# Patient Record
Sex: Male | Born: 1985 | Race: Black or African American | Hispanic: No | Marital: Married | Smoking: Never smoker
Health system: Southern US, Community
[De-identification: ages and names within clinical notes are randomized; demographics above are authoritative.]

## PROBLEM LIST (undated history)

## (undated) DIAGNOSIS — R079 Chest pain, unspecified: Secondary | ICD-10-CM

## (undated) DIAGNOSIS — F329 Major depressive disorder, single episode, unspecified: Secondary | ICD-10-CM

## (undated) DIAGNOSIS — I10 Essential (primary) hypertension: Secondary | ICD-10-CM

## (undated) DIAGNOSIS — F32A Depression, unspecified: Secondary | ICD-10-CM

---
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
## 2016-07-29 HISTORY — PX: CARDIAC SURGERY: SHX584

## 2016-09-09 ENCOUNTER — Emergency Department (HOSPITAL_COMMUNITY)
Admission: EM | Admit: 2016-09-09 | Discharge: 2016-09-09 | Disposition: A | Discharge status: Home / Self Care | Payer: Medicaid Other | Attending: Emergency Medicine | Admitting: Emergency Medicine

## 2016-09-09 ENCOUNTER — Emergency Department (HOSPITAL_COMMUNITY): Payer: Medicaid Other

## 2016-09-09 ENCOUNTER — Encounter (HOSPITAL_COMMUNITY): Payer: Self-pay | Admitting: Emergency Medicine

## 2016-09-09 DIAGNOSIS — R791 Abnormal coagulation profile: Secondary | ICD-10-CM | POA: Insufficient documentation

## 2016-09-09 DIAGNOSIS — R05 Cough: Secondary | ICD-10-CM | POA: Diagnosis present

## 2016-09-09 DIAGNOSIS — J189 Pneumonia, unspecified organism: Secondary | ICD-10-CM

## 2016-09-09 DIAGNOSIS — I10 Essential (primary) hypertension: Secondary | ICD-10-CM | POA: Insufficient documentation

## 2016-09-09 HISTORY — DX: Essential (primary) hypertension: I10

## 2016-09-09 LAB — PROTIME-INR
INR: 1.71
PROTHROMBIN TIME: 20.2 s — AB (ref 11.4–15.2)

## 2016-09-09 LAB — CBC
HCT: 34.5 % — ABNORMAL LOW (ref 39.0–52.0)
HEMOGLOBIN: 11.5 g/dL — AB (ref 13.0–17.0)
MCH: 29.8 pg (ref 26.0–34.0)
MCHC: 33.3 g/dL (ref 30.0–36.0)
MCV: 89.4 fL (ref 78.0–100.0)
PLATELETS: 302 10*3/uL (ref 150–400)
RBC: 3.86 MIL/uL — ABNORMAL LOW (ref 4.22–5.81)
RDW: 13.5 % (ref 11.5–15.5)
WBC: 9.1 10*3/uL (ref 4.0–10.5)

## 2016-09-09 LAB — BASIC METABOLIC PANEL
ANION GAP: 7 (ref 5–15)
BUN: 12 mg/dL (ref 6–20)
CALCIUM: 9.5 mg/dL (ref 8.9–10.3)
CO2: 28 mmol/L (ref 22–32)
Chloride: 101 mmol/L (ref 101–111)
Creatinine, Ser: 0.85 mg/dL (ref 0.61–1.24)
GLUCOSE: 106 mg/dL — AB (ref 65–99)
Potassium: 4 mmol/L (ref 3.5–5.1)
Sodium: 136 mmol/L (ref 135–145)

## 2016-09-09 MED ORDER — AMOXICILLIN-POT CLAVULANATE 875-125 MG PO TABS: 1.0000 | ORAL_TABLET | Freq: Two times a day (BID) | ORAL | 0 refills | Status: DC

## 2016-09-09 NOTE — ED Provider Notes (Signed)
[facility] DEPT Provider Note   CSN: [id number] Arrival date & time: 09/09/16  4098     History   Chief Complaint Chief Complaint  Patient presents with  . Fever  . Chest Pain    HPI Robert Mcmahon is a 31 y.o. male.  31 year old male with history of mitral valve replacement 6 weeks ago presents with cough and congestion 2 days. Also notes nasal congestion as well as bilateral ear pain. No vomiting or diarrhea. Some subjective myalgias. Denies any headache or photophobia. No pedal edema. Symptoms are persistent and nothing makes them better worse. No treatment use prior to arrival.      Past Medical History:  Diagnosis Date  . Hypertension     There are no active problems to display for this patient.   Past Surgical History:  Procedure Laterality Date  . CARDIAC SURGERY         Home Medications    Prior to Admission medications   Not on File    Family History No family history on file.  Social History Social History  Substance Use Topics  . Smoking status: Never Smoker  . Smokeless tobacco: Never Used  . Alcohol use No     Allergies   Patient has no known allergies.   Review of Systems Review of Systems  All other systems reviewed and are negative.    Physical Exam Updated Vital Signs BP 130/86 (BP Location: Left Arm)   Pulse 80   Temp 97.8 F (36.6 C) (Oral)   Resp 16   Ht  (1.702 m)   SpO2 99%   Physical Exam  Constitutional: He is oriented to person, place, and time. He appears well-developed and well-nourished.  Non-toxic appearance. No distress.  HENT:  Head: Normocephalic and atraumatic.  Eyes: Conjunctivae, EOM and lids are normal. Pupils are equal, round, and reactive to light.  Neck: Normal range of motion. Neck supple. No tracheal deviation present. No thyroid mass present.  Cardiovascular: Normal rate, regular rhythm and normal heart sounds.  Exam reveals no gallop.   No murmur heard. Pulmonary/Chest: Effort  normal and breath sounds normal. No stridor. No respiratory distress. He has no decreased breath sounds. He has no wheezes. He has no rhonchi. He has no rales.  Abdominal: Soft. Normal appearance and bowel sounds are normal. He exhibits no distension. There is no tenderness. There is no rebound and no CVA tenderness.  Musculoskeletal: Normal range of motion. He exhibits no edema or tenderness.  Neurological: He is alert and oriented to person, place, and time. He has normal strength. No cranial nerve deficit or sensory deficit. GCS eye subscore is 4. GCS verbal subscore is 5. GCS motor subscore is 6.  Skin: Skin is warm and dry. No abrasion and no rash noted.  Psychiatric: He has a normal mood and affect. His speech is normal and behavior is normal.  Nursing note and vitals reviewed.    ED Treatments / Results  Labs (all labs ordered are listed, but only abnormal results are displayed) Labs Reviewed  BASIC METABOLIC PANEL  CBC  PROTIME-INR  I-STAT TROPOININ, ED    EKG  EKG Interpretation None       Radiology Dg Chest 2 View  Result Date: 09/09/2016 CLINICAL DATA:  Status post open heart surgery 6 weeks ago. Onset of malaise and fever and new left-sided chest discomfort 4 days ago. EXAM: CHEST  2 VIEW COMPARISON:  None. FINDINGS: The lungs are well-expanded. There is patchy increased density  in the right perihilar region. Minimal similar changes are noted on the left. The heart is normal in size. The pulmonary vascularity is not engorged. There is no pleural effusion. The sternal wires appear intact. There are prosthetic cardiac valve rings in the mitral and tricuspid positions. The retrosternal soft tissues appear normal. The bony thorax exhibits no acute abnormality. IMPRESSION: Patchy increased perihilar density greatest on the right but to a lesser extent on the left may reflect atelectasis or early pneumonia. No overt CHF. Electronically Signed   By: David  [place] M.D.   On:  09/09/2016 08:03    Procedures Procedures (including critical care time)  Medications Ordered in ED Medications - No data to display   Initial Impression / Assessment and Plan / ED Course  I have reviewed the triage vital signs and the nursing notes.  Pertinent labs & imaging results that were available during my care of the patient were reviewed by me and considered in my medical decision making (see chart for details).     Patient with evidence of pneumonia on chest x-ray. Will place on Zithromax and return precautions given  Final Clinical Impressions(s) / ED Diagnoses   Final diagnoses:  None    New Prescriptions New Prescriptions   No medications on file     Lorre Nick, MD 09/09/16 [phone]

## 2016-09-09 NOTE — ED Notes (Signed)
Patient transported to X-ray 

## 2016-09-09 NOTE — ED Triage Notes (Addendum)
patient reports he had open heart surgery 6 weeks ago in [place] and over the past 4 days has been running fevers (but hasnt checked his temperature), not feeling well and was told that if he doesn't feel well after surgery to go to hospital.  Patient also c/o chest pain in left side.

## 2016-09-18 ENCOUNTER — Emergency Department (HOSPITAL_COMMUNITY): Payer: Medicaid Other

## 2016-09-18 ENCOUNTER — Inpatient Hospital Stay (HOSPITAL_COMMUNITY)
Admission: EM | Admit: 2016-09-18 | Discharge: 2016-09-22 | DRG: 287 | Disposition: A | Discharge status: Home / Self Care | Payer: Medicaid Other | Attending: Internal Medicine | Admitting: Internal Medicine

## 2016-09-18 ENCOUNTER — Encounter (HOSPITAL_COMMUNITY): Payer: Self-pay | Admitting: Emergency Medicine

## 2016-09-18 DIAGNOSIS — R079 Chest pain, unspecified: Secondary | ICD-10-CM | POA: Diagnosis present

## 2016-09-18 DIAGNOSIS — R0789 Other chest pain: Secondary | ICD-10-CM | POA: Diagnosis present

## 2016-09-18 DIAGNOSIS — I10 Essential (primary) hypertension: Secondary | ICD-10-CM | POA: Diagnosis present

## 2016-09-18 DIAGNOSIS — Z9889 Other specified postprocedural states: Secondary | ICD-10-CM | POA: Diagnosis not present

## 2016-09-18 DIAGNOSIS — R748 Abnormal levels of other serum enzymes: Secondary | ICD-10-CM | POA: Diagnosis not present

## 2016-09-18 DIAGNOSIS — I272 Pulmonary hypertension, unspecified: Secondary | ICD-10-CM | POA: Diagnosis present

## 2016-09-18 DIAGNOSIS — Z952 Presence of prosthetic heart valve: Secondary | ICD-10-CM | POA: Diagnosis not present

## 2016-09-18 DIAGNOSIS — R7989 Other specified abnormal findings of blood chemistry: Secondary | ICD-10-CM

## 2016-09-18 DIAGNOSIS — R778 Other specified abnormalities of plasma proteins: Secondary | ICD-10-CM | POA: Diagnosis present

## 2016-09-18 DIAGNOSIS — Z79899 Other long term (current) drug therapy: Secondary | ICD-10-CM

## 2016-09-18 DIAGNOSIS — Z7901 Long term (current) use of anticoagulants: Secondary | ICD-10-CM

## 2016-09-18 DIAGNOSIS — Z7982 Long term (current) use of aspirin: Secondary | ICD-10-CM

## 2016-09-18 DIAGNOSIS — R0600 Dyspnea, unspecified: Secondary | ICD-10-CM

## 2016-09-18 DIAGNOSIS — R791 Abnormal coagulation profile: Secondary | ICD-10-CM | POA: Diagnosis present

## 2016-09-18 DIAGNOSIS — R0602 Shortness of breath: Secondary | ICD-10-CM | POA: Diagnosis not present

## 2016-09-18 DIAGNOSIS — R072 Precordial pain: Secondary | ICD-10-CM | POA: Diagnosis not present

## 2016-09-18 HISTORY — DX: Chest pain, unspecified: R07.9

## 2016-09-18 LAB — BASIC METABOLIC PANEL
Anion gap: 7 (ref 5–15)
BUN: 15 mg/dL (ref 6–20)
CHLORIDE: 102 mmol/L (ref 101–111)
CO2: 29 mmol/L (ref 22–32)
CREATININE: 0.92 mg/dL (ref 0.61–1.24)
Calcium: 9.9 mg/dL (ref 8.9–10.3)
GFR calc non Af Amer: >60 mL/min (ref >60)
GLUCOSE: 114 mg/dL — AB (ref 65–99)
Potassium: 3.7 mmol/L (ref 3.5–5.1)
Sodium: 138 mmol/L (ref 135–145)

## 2016-09-18 LAB — SEDIMENTATION RATE: SED RATE: 11 mm/h (ref 0–16)

## 2016-09-18 LAB — I-STAT TROPONIN, ED
Troponin i, poc: 0.44 ng/mL (ref 0.00–0.08)
Troponin i, poc: 0.91 ng/mL (ref 0.00–0.08)

## 2016-09-18 LAB — CBC
HCT: 38.4 % — ABNORMAL LOW (ref 39.0–52.0)
Hemoglobin: 12.7 g/dL — ABNORMAL LOW (ref 13.0–17.0)
MCH: 28.9 pg (ref 26.0–34.0)
MCHC: 33.1 g/dL (ref 30.0–36.0)
MCV: 87.3 fL (ref 78.0–100.0)
PLATELETS: 414 10*3/uL — AB (ref 150–400)
RBC: 4.4 MIL/uL (ref 4.22–5.81)
RDW: 12.8 % (ref 11.5–15.5)
WBC: 9.2 10*3/uL (ref 4.0–10.5)

## 2016-09-18 LAB — TROPONIN I: TROPONIN I: 1.09 ng/mL — AB (ref <0.03)

## 2016-09-18 LAB — PROTIME-INR
INR: 1.5
Prothrombin Time: 18.2 seconds — ABNORMAL HIGH (ref 11.4–15.2)

## 2016-09-18 MED ORDER — WARFARIN SODIUM 5 MG PO TABS
5.0000 mg | ORAL_TABLET | Freq: Once | ORAL | Status: AC
  Administered 2016-09-18: 5 mg via ORAL
  Filled 2016-09-18: qty 1

## 2016-09-18 MED ORDER — SODIUM CHLORIDE 0.9 % IV SOLN
INTRAVENOUS | Status: DC
  Administered 2016-09-18: 16:00:00 via INTRAVENOUS

## 2016-09-18 MED ORDER — FENTANYL CITRATE (PF) 100 MCG/2ML IJ SOLN
50.0000 ug | Freq: Once | INTRAMUSCULAR | Status: AC
  Administered 2016-09-18: 50 ug via INTRAVENOUS
  Filled 2016-09-18: qty 2

## 2016-09-18 MED ORDER — FENTANYL CITRATE (PF) 100 MCG/2ML IJ SOLN
100.0000 ug | Freq: Once | INTRAMUSCULAR | Status: AC
  Administered 2016-09-18: 100 ug via INTRAVENOUS
  Filled 2016-09-18: qty 2

## 2016-09-18 MED ORDER — ASPIRIN 81 MG PO CHEW
324.0000 mg | CHEWABLE_TABLET | Freq: Once | ORAL | Status: AC
  Administered 2016-09-18: 324 mg via ORAL
  Filled 2016-09-18: qty 4

## 2016-09-18 MED ORDER — WARFARIN - PHARMACIST DOSING INPATIENT: Freq: Every day | Status: DC

## 2016-09-18 MED ORDER — ONDANSETRON HCL 4 MG/2ML IJ SOLN
4.0000 mg | Freq: Once | INTRAMUSCULAR | Status: AC
  Administered 2016-09-18: 4 mg via INTRAVENOUS
  Filled 2016-09-18: qty 2

## 2016-09-18 NOTE — ED Provider Notes (Signed)
[facility] Provider Note   CSN: [id number] Arrival date & time: 09/18/16  1407     History   Chief Complaint Chief Complaint  Patient presents with  . Chest Pain  . Fatigue    HPI Robert Mcmahon is a 31 y.o. male.  He presents for evaluation of chest discomfort, bilateral anterior which he noticed this morning.  The pain is constant and at the time seen, 8 over 10.  There is no associated shortness of breath, fever, weakness or dizziness.  He has had intermittent cough for 2 days.  He also had some cough 10 days ago when he was seen and evaluated in the emergency department started on Zithromax for possible pneumonia.  He does not currently have a physician here in [place].  6 weeks ago he had replacement of native diseased mitral valve, congenital, that was "leaking".  He states he is compliant with his medications.  He is currently here in [place], visiting family members.  He denies fever, neck pain, back pain, or headache.  There are no other known modifying factors.  HPI  Past Medical History:  Diagnosis Date  . Hypertension     Patient Active Problem List   Diagnosis Date Noted  . Chest pain 09/18/2016    Past Surgical History:  Procedure Laterality Date  . CARDIAC SURGERY         Home Medications    Prior to Admission medications   Medication Sig Start Date End Date Taking? Authorizing Provider  aspirin EC 81 MG tablet Take 81 mg by mouth daily.   Yes [provider]  docusate sodium (COLACE) 100 MG capsule Take 100 mg by mouth 2 (two) times daily.   Yes [provider]  ibuprofen (ADVIL,MOTRIN) 200 MG tablet Take 600 mg by mouth every 6 (six) hours as needed for moderate pain.   Yes [provider]  ketorolac (TORADOL) 10 MG tablet Take 10 mg by mouth every 6 (six) hours as needed for severe pain.   Yes [provider]  warfarin (COUMADIN) 3 MG tablet Take 3 mg by mouth daily. Take 4.5 mg by mouth daily. Pt takes  3mg  by mouth every morning and 1.5 mg by mouth every evening.   Yes [provider]  amoxicillin-clavulanate (AUGMENTIN) 875-125 MG tablet Take 1 tablet by mouth every 12 (twelve) hours. Patient not taking: Reported on 09/18/2016 09/09/16   Lorre Nick, MD    Family History No family history on file.  Social History Social History  Substance Use Topics  . Smoking status: Never Smoker  . Smokeless tobacco: Never Used  . Alcohol use No     Allergies   Patient has no known allergies.   Review of Systems Review of Systems  All other systems reviewed and are negative.    Physical Exam Updated Vital Signs BP 130/78 (BP Location: Right Arm)   Pulse 70   Temp 98.4 F (36.9 C) (Oral)   Resp 18   Ht 5\' 7"  (1.702 m)   Wt 132 lb (59.9 kg)   SpO2 100%   BMI 20.67 kg/m   Physical Exam  Constitutional: He is oriented to person, place, and time. He appears well-developed and well-nourished.  HENT:  Head: Normocephalic and atraumatic.  Right Ear: External ear normal.  Left Ear: External ear normal.  Eyes: Conjunctivae and EOM are normal. Pupils are equal, round, and reactive to light.  Neck: Normal range of motion and phonation normal. Neck supple.  Cardiovascular: Normal  rate and regular rhythm.  Exam reveals no gallop and no friction rub.   Murmur (3 or 6 anterior chest, systolic) heard. Pulmonary/Chest: Effort normal and breath sounds normal. He exhibits no bony tenderness.  Abdominal: Soft. There is no tenderness.  Musculoskeletal: Normal range of motion.  Neurological: He is alert and oriented to person, place, and time. No cranial nerve deficit or sensory deficit. He exhibits normal muscle tone. Coordination normal.  Skin: Skin is warm, dry and intact.  Psychiatric: He has a normal mood and affect. His behavior is normal. Judgment and thought content normal.  Nursing note and vitals reviewed.    ED Treatments / Results  Labs (all labs ordered are listed,  but only abnormal results are displayed) Labs Reviewed  BASIC METABOLIC PANEL - Abnormal; Notable for the following:       Result Value   Glucose, Bld 114 (*)    All other components within normal limits  CBC - Abnormal; Notable for the following:    Hemoglobin 12.7 (*)    HCT 38.4 (*)    Platelets 414 (*)    All other components within normal limits  PROTIME-INR - Abnormal; Notable for the following:    Prothrombin Time 18.2 (*)    All other components within normal limits  TROPONIN I - Abnormal; Notable for the following:    Troponin I 1.09 (*)    All other components within normal limits  I-STAT TROPOININ, ED - Abnormal; Notable for the following:    Troponin i, poc 0.44 (*)    All other components within normal limits  I-STAT TROPOININ, ED - Abnormal; Notable for the following:    Troponin i, poc 0.91 (*)    All other components within normal limits  SEDIMENTATION RATE  PROTIME-INR  C-REACTIVE PROTEIN    EKG  EKG Interpretation  Date/Time:  Wednesday Sep 18 2016 14:43:44 EDT Ventricular Rate:  95 PR Interval:    QRS Duration: 110 QT Interval:  358 QTC Calculation: 450 R Axis:   -44 Text Interpretation:  Sinus rhythm Prolonged PR interval LVH with IVCD, LAD and secondary repol abnrm since last tracing no significant change Confirmed by Mancel BaleWentz, Sueanne Maniaci [phone]([id number]) on 09/18/2016 3:34:31 PM       Radiology Dg Chest 2 View  Result Date: 09/18/2016 CLINICAL DATA:  Chest pain EXAM: CHEST  2 VIEW COMPARISON:  09/09/2016 FINDINGS: Cardiac shadow is within normal limits. Postsurgical changes are seen. No focal infiltrate or sizable effusion is seen. No bony abnormality is noted. IMPRESSION: No acute abnormality noted. Stable postoperative changes. Electronically Signed   By: Alcide CleverMark  Lukens M.D.   On: 09/18/2016 15:29    Procedures Procedures (including critical care time)  Medications Ordered in ED Medications  0.9 %  sodium chloride infusion ( Intravenous New Bag/Given 09/18/16  1610)  Warfarin - Pharmacist Dosing Inpatient (not administered)  fentaNYL (SUBLIMAZE) injection 50 mcg (50 mcg Intravenous Given 09/18/16 1610)  ondansetron (ZOFRAN) injection 4 mg (4 mg Intravenous Given 09/18/16 1610)  aspirin chewable tablet 324 mg (324 mg Oral Given 09/18/16 1609)  warfarin (COUMADIN) tablet 5 mg (5 mg Oral Given 09/18/16 1927)  fentaNYL (SUBLIMAZE) injection 100 mcg (100 mcg Intravenous Given 09/18/16 2116)     Initial Impression / Assessment and Plan / ED Course  I have reviewed the triage vital signs and the nursing notes.  Pertinent labs & imaging results that were available during my care of the patient were reviewed by me and considered in my medical decision  making (see chart for details).  Clinical Course as of Sep 18 2212  Wed Sep 18, 2016  1617 Slight elevation, no comparisons available Troponin i, poc: (!!) 0.44 [EW]  1617 Subtherapeutic Prothrombin Time: (!) 18.2 [EW]  1618 Slightly low Hemoglobin: (!) 12.7 [EW]  1619 Patient is nontoxic, with minimally elevated troponin level and nonischemic EKG.  Doubt ACS, or unstable cardiac process.  We will attempt to obtain old records from his facility in [place] where he had surgery 6 weeks ago.  We will continue to reassess and anticipate getting a delta troponin, at 6:15 PM.  [EW]  1951 Rising delta troponin Troponin i, poc: (!!) 0.91 [EW]  1956 At this time the patient states his pain improved after the fentanyl, but is still present at 6/10.  No further complaints.  Still awaiting records transferred from [place].  Will contact cardiology to discuss case and get guidance on next step.  [EW]  2007 Discussed with cardiology, Dr. Mayford Knife, who recommends checking the lab troponin, and try to assess if patient has had a cardiac catheterization, when his records get sent.  Will recontact cardiology after the troponin results.  [EW]  2117 His chest pain is returning, and he would like some more medicine now.  He states the  pain is not pleuritic.  It continues to be bilateral and anterior.  [EW]    Clinical Course User Index [EW] Mancel Bale, MD    Medications  0.9 %  sodium chloride infusion ( Intravenous New Bag/Given 09/18/16 1610)  Warfarin - Pharmacist Dosing Inpatient (not administered)  fentaNYL (SUBLIMAZE) injection 50 mcg (50 mcg Intravenous Given 09/18/16 1610)  ondansetron (ZOFRAN) injection 4 mg (4 mg Intravenous Given 09/18/16 1610)  aspirin chewable tablet 324 mg (324 mg Oral Given 09/18/16 1609)  warfarin (COUMADIN) tablet 5 mg (5 mg Oral Given 09/18/16 1927)  fentaNYL (SUBLIMAZE) injection 100 mcg (100 mcg Intravenous Given 09/18/16 2116)    Patient Vitals for the past 24 hrs:  BP Temp Temp src Pulse Resp SpO2 Height Weight  09/18/16 1919 130/78 98.4 F (36.9 C) Oral 70 18 100 % - -  09/18/16 1430 - - - - - - 5\' 7"  (1.702 m) 132 lb (59.9 kg)  09/18/16 1429 130/76 98.1 F (36.7 C) Oral (!) 101 - 100 % - -   22: 05-discussed with cardiology, fellow on-call, who accepts patient in transfer to admit at [facility].  10:14 PM Reevaluation with update and discussion. After initial assessment and treatment, an updated evaluation reveals he is fairly comfortable but continues to be in pain.  Findings discussed with the patient.  He agrees to transfer, for further evaluation and treatment. Gracelin Weisberg L    Final Clinical Impressions(s) / ED Diagnoses   Final diagnoses:  Chest pain, unspecified type   Nonspecific chest pain with elevated troponin, 6 weeks post replacement mitral valve, with mechanical device.  INR subtherapeutic, treated with oral warfarin.  Troponin climbing during ED evaluation.  Clinically patient with chest wall discomfort, and no associated shortness of breath.  Old records obtained from [facility] health care system in [place], indicate that he had a normal cardiac catheterization, with the exception of pulmonary hypertension and severe mitral regurgitation.   January 2018 however there is no direct, on the coronary arteries.  He requires cardiology evaluation.    Nursing Notes Reviewed/ Care Coordinated Applicable Imaging Reviewed Interpretation of Laboratory Data incorporated into ED treatment   New Prescriptions New Prescriptions  No medications on file     Mancel Bale, MD 09/19/16 [phone]

## 2016-09-18 NOTE — ED Notes (Signed)
Date and time results received: 9:25 PM Test: Troponin Critical Value: 1.09  Name of Provider Notified: Effie ShyWentz

## 2016-09-18 NOTE — Progress Notes (Signed)
ANTICOAGULATION CONSULT NOTE - Initial Consult  Pharmacy Consult for warfarin Indication: mechanical valve  No Known Allergies  Patient Measurements: Height: 5\' 7"  (170.2 cm) Weight: 132 lb (59.9 kg) IBW/kg (Calculated) : 66.1   Vital Signs: Temp: 98.1 F (36.7 C) (05/09 1429) Temp Source: Oral (05/09 1429) BP: 130/76 (05/09 1429) Pulse Rate: 101 (05/09 1429)  Labs:  Recent Labs  09/18/16 1431 09/18/16 1512  HGB 12.7*  --   HCT 38.4*  --   PLT 414*  --   LABPROT  --  18.2*  INR  --  1.50  CREATININE 0.92  --     Estimated Creatinine Clearance: 97.7 mL/min (by C-G formula based on SCr of 0.92 mg/dL).   Medical History: Past Medical History:  Diagnosis Date  . Hypertension      Assessment: 31 yo male presents with chest discomfort.   6 weeks ago he had replacement of native diseased mitral valve, congenital, that was "leaking".  PTA warfarin of 4.5mg  daily (takes 3mg  in the am and 1.5mg  in the pm)  09/18/2016 INR 1.5 subtherapeutic Hgb 12.7 Plts 414 DI: Pt also takes daily ASA and prn ibuprofen,  azithromycin ~10 days ago for pna (this interaction would increase INR)   Goal of Therapy:  INR 2.5-3.5   Plan:  Pt took 3mg  dose this am will give an additional 5mg  tonight  Consider adding enoxaparin til INR therapeutic  Daily INR  Arley Phenixllen Shirline Kendle RPh 09/18/2016, 4:33 PM Pager [phone]

## 2016-09-18 NOTE — ED Notes (Signed)
Notified EDP,Wentz,MD., pt. I-stat troponin results 0.91 and RN,Matt made aware.

## 2016-09-18 NOTE — ED Triage Notes (Signed)
Pt from home with complaints of chest pain and fatigue. Pt states his chest pain is in the center of his chest and does not radiate. He states pain began this morning. Pt states he had open heart surgery 7 weeks ago for a leaky valve. Pt reports pain is 8/10. Pt has strong bilateral radial pulses and normal neuro exam.

## 2016-09-19 ENCOUNTER — Inpatient Hospital Stay (HOSPITAL_COMMUNITY): Payer: Medicaid Other

## 2016-09-19 ENCOUNTER — Encounter (HOSPITAL_COMMUNITY): Payer: Self-pay | Admitting: General Practice

## 2016-09-19 DIAGNOSIS — R0789 Other chest pain: Principal | ICD-10-CM

## 2016-09-19 DIAGNOSIS — Z9889 Other specified postprocedural states: Secondary | ICD-10-CM

## 2016-09-19 DIAGNOSIS — R079 Chest pain, unspecified: Secondary | ICD-10-CM

## 2016-09-19 DIAGNOSIS — R072 Precordial pain: Secondary | ICD-10-CM

## 2016-09-19 DIAGNOSIS — Z952 Presence of prosthetic heart valve: Secondary | ICD-10-CM

## 2016-09-19 DIAGNOSIS — R0600 Dyspnea, unspecified: Secondary | ICD-10-CM

## 2016-09-19 DIAGNOSIS — R7989 Other specified abnormal findings of blood chemistry: Secondary | ICD-10-CM

## 2016-09-19 DIAGNOSIS — R748 Abnormal levels of other serum enzymes: Secondary | ICD-10-CM

## 2016-09-19 DIAGNOSIS — R778 Other specified abnormalities of plasma proteins: Secondary | ICD-10-CM

## 2016-09-19 HISTORY — DX: Chest pain, unspecified: R07.9

## 2016-09-19 LAB — TROPONIN I
TROPONIN I: 0.52 ng/mL — AB (ref <0.03)
TROPONIN I: 0.52 ng/mL — AB (ref <0.03)
Troponin I: 0.68 ng/mL (ref <0.03)

## 2016-09-19 LAB — CBC
HEMATOCRIT: 34.8 % — AB (ref 39.0–52.0)
Hemoglobin: 11.5 g/dL — ABNORMAL LOW (ref 13.0–17.0)
MCH: 28.9 pg (ref 26.0–34.0)
MCHC: 33 g/dL (ref 30.0–36.0)
MCV: 87.4 fL (ref 78.0–100.0)
PLATELETS: 350 10*3/uL (ref 150–400)
RBC: 3.98 MIL/uL — AB (ref 4.22–5.81)
RDW: 13 % (ref 11.5–15.5)
WBC: 7.2 10*3/uL (ref 4.0–10.5)

## 2016-09-19 LAB — PROTIME-INR
INR: 1.91
Prothrombin Time: 22.2 seconds — ABNORMAL HIGH (ref 11.4–15.2)

## 2016-09-19 LAB — BASIC METABOLIC PANEL
Anion gap: 9 (ref 5–15)
BUN: 10 mg/dL (ref 6–20)
CALCIUM: 9.2 mg/dL (ref 8.9–10.3)
CO2: 25 mmol/L (ref 22–32)
Chloride: 103 mmol/L (ref 101–111)
Creatinine, Ser: 0.93 mg/dL (ref 0.61–1.24)
GFR calc Af Amer: >60 mL/min (ref >60)
GLUCOSE: 89 mg/dL (ref 65–99)
Potassium: 3.9 mmol/L (ref 3.5–5.1)
Sodium: 137 mmol/L (ref 135–145)

## 2016-09-19 LAB — C-REACTIVE PROTEIN
CRP: 0.9 mg/dL (ref <1.0)
CRP: <0.8 mg/dL (ref <1.0)

## 2016-09-19 LAB — CBC WITH DIFFERENTIAL/PLATELET
BASOS PCT: 0 %
Basophils Absolute: 0 10*3/uL (ref 0.0–0.1)
EOS PCT: 2 %
Eosinophils Absolute: 0.1 10*3/uL (ref 0.0–0.7)
HCT: 34.8 % — ABNORMAL LOW (ref 39.0–52.0)
Hemoglobin: 11.1 g/dL — ABNORMAL LOW (ref 13.0–17.0)
LYMPHS PCT: 30 %
Lymphs Abs: 2 10*3/uL (ref 0.7–4.0)
MCH: 27.8 pg (ref 26.0–34.0)
MCHC: 31.9 g/dL (ref 30.0–36.0)
MCV: 87 fL (ref 78.0–100.0)
MONO ABS: 0.4 10*3/uL (ref 0.1–1.0)
Monocytes Relative: 6 %
Neutro Abs: 4.3 10*3/uL (ref 1.7–7.7)
Neutrophils Relative %: 62 %
PLATELETS: 326 10*3/uL (ref 150–400)
RBC: 4 MIL/uL — ABNORMAL LOW (ref 4.22–5.81)
RDW: 13 % (ref 11.5–15.5)
WBC: 6.8 10*3/uL (ref 4.0–10.5)

## 2016-09-19 LAB — HEPARIN LEVEL (UNFRACTIONATED)
HEPARIN UNFRACTIONATED: 0.65 [IU]/mL (ref 0.30–0.70)
HEPARIN UNFRACTIONATED: 0.36 [IU]/mL (ref 0.30–0.70)

## 2016-09-19 LAB — MRSA PCR SCREENING: MRSA by PCR: NEGATIVE

## 2016-09-19 LAB — ECHOCARDIOGRAM COMPLETE
Height: 67 in
Weight: 2083.2 oz

## 2016-09-19 LAB — HIV ANTIBODY (ROUTINE TESTING W REFLEX): HIV SCREEN 4TH GENERATION: NONREACTIVE

## 2016-09-19 LAB — D-DIMER, QUANTITATIVE: D-Dimer, Quant: 0.32 ug/mL-FEU (ref 0.00–0.50)

## 2016-09-19 LAB — SEDIMENTATION RATE: Sed Rate: 15 mm/hr (ref 0–16)

## 2016-09-19 MED ORDER — ASPIRIN EC 81 MG PO TBEC
81.0000 mg | DELAYED_RELEASE_TABLET | Freq: Every day | ORAL | Status: DC
  Administered 2016-09-21 – 2016-09-22 (×2): 81 mg via ORAL
  Filled 2016-09-19 (×2): qty 1

## 2016-09-19 MED ORDER — ASPIRIN 300 MG RE SUPP: 300.0000 mg | RECTAL | Status: AC

## 2016-09-19 MED ORDER — ONDANSETRON HCL 4 MG/2ML IJ SOLN: 4.0000 mg | Freq: Four times a day (QID) | INTRAMUSCULAR | Status: DC | PRN

## 2016-09-19 MED ORDER — NITROGLYCERIN 0.4 MG SL SUBL
0.4000 mg | SUBLINGUAL_TABLET | SUBLINGUAL | Status: DC | PRN
Start: 2016-09-19 — End: 2016-09-22

## 2016-09-19 MED ORDER — HEPARIN (PORCINE) IN NACL 100-0.45 UNIT/ML-% IJ SOLN
900.0000 [IU]/kg/h | INTRAMUSCULAR | Status: DC
  Administered 2016-09-19 – 2016-09-20 (×2): 900 [IU]/kg/h via INTRAVENOUS
  Filled 2016-09-19 (×2): qty 250

## 2016-09-19 MED ORDER — SODIUM CHLORIDE 0.9 % IV SOLN
INTRAVENOUS | Status: DC
  Administered 2016-09-19 (×2): via INTRAVENOUS
  Administered 2016-09-20: 100 mL/h via INTRAVENOUS
  Administered 2016-09-21 – 2016-09-22 (×2): via INTRAVENOUS

## 2016-09-19 MED ORDER — WARFARIN SODIUM 2 MG PO TABS: 3.0000 mg | ORAL_TABLET | Freq: Every day | ORAL | Status: DC

## 2016-09-19 MED ORDER — IBUPROFEN 600 MG PO TABS
600.0000 mg | ORAL_TABLET | Freq: Three times a day (TID) | ORAL | Status: DC
  Administered 2016-09-19 – 2016-09-22 (×9): 600 mg via ORAL
  Filled 2016-09-19 (×9): qty 1

## 2016-09-19 MED ORDER — MORPHINE SULFATE (PF) 4 MG/ML IV SOLN
4.0000 mg | INTRAVENOUS | Status: DC | PRN
Start: 2016-09-19 — End: 2016-09-22
  Administered 2016-09-19 – 2016-09-20 (×2): 4 mg via INTRAVENOUS
  Filled 2016-09-19 (×2): qty 1

## 2016-09-19 MED ORDER — HEPARIN BOLUS VIA INFUSION
2500.0000 [IU] | Freq: Once | INTRAVENOUS | Status: AC
  Administered 2016-09-19: 2500 [IU] via INTRAVENOUS
  Filled 2016-09-19: qty 2500

## 2016-09-19 MED ORDER — ACETAMINOPHEN 325 MG PO TABS: 650.0000 mg | ORAL_TABLET | ORAL | Status: DC | PRN

## 2016-09-19 MED ORDER — ASPIRIN 81 MG PO CHEW
324.0000 mg | CHEWABLE_TABLET | ORAL | Status: AC
  Administered 2016-09-19: 324 mg via ORAL
  Filled 2016-09-19: qty 4

## 2016-09-19 MED ORDER — KETOROLAC TROMETHAMINE 10 MG PO TABS: 10.0000 mg | ORAL_TABLET | Freq: Four times a day (QID) | ORAL | Status: DC | PRN

## 2016-09-19 MED ORDER — NITROGLYCERIN 0.4 MG SL SUBL
SUBLINGUAL_TABLET | SUBLINGUAL | Status: AC
  Administered 2016-09-19: 03:00:00
  Filled 2016-09-19: qty 3

## 2016-09-19 MED ORDER — DOCUSATE SODIUM 100 MG PO CAPS
100.0000 mg | ORAL_CAPSULE | Freq: Two times a day (BID) | ORAL | Status: DC
  Administered 2016-09-19 – 2016-09-22 (×6): 100 mg via ORAL
  Filled 2016-09-19 (×7): qty 1

## 2016-09-19 MED ORDER — ASPIRIN EC 81 MG PO TBEC: 81.0000 mg | DELAYED_RELEASE_TABLET | Freq: Every day | ORAL | Status: DC

## 2016-09-19 MED ORDER — COLCHICINE 0.6 MG PO TABS
0.6000 mg | ORAL_TABLET | Freq: Two times a day (BID) | ORAL | Status: DC
  Administered 2016-09-19 – 2016-09-22 (×7): 0.6 mg via ORAL
  Filled 2016-09-19 (×7): qty 1

## 2016-09-19 MED ORDER — WARFARIN SODIUM 4 MG PO TABS
4.0000 mg | ORAL_TABLET | Freq: Once | ORAL | Status: DC
Start: 2016-09-19 — End: 2016-09-19

## 2016-09-19 NOTE — Progress Notes (Signed)
ANTICOAGULATION CONSULT NOTE - follow up  Pharmacy Consult for heparin Indication: mechanical valve  No Known Allergies  Patient Measurements: Height: 5\' 7"  (170.2 cm) Weight: 130 lb 3.2 oz (59.1 kg) IBW/kg (Calculated) : 66.1 Heparin Dosing Weight: 59 Kg  Vital Signs: Temp: 98.1 F (36.7 C) (05/10 1206) Temp Source: Oral (05/10 1206) BP: 112/73 (05/10 1206) Pulse Rate: 79 (05/10 1206)  Labs:  Recent Labs  09/18/16 1431 09/18/16 1512 09/18/16 2028 09/19/16 0711 09/19/16 1214  HGB 12.7*  --   --  11.1* 11.5*  HCT 38.4*  --   --  34.8* 34.8*  PLT 414*  --   --  326 350  LABPROT  --  18.2*  --   --  22.2*  INR  --  1.50  --   --  1.91  HEPARINUNFRC  --   --   --   --  0.65  CREATININE 0.92  --   --  0.93  --   TROPONINI  --   --  1.09* 0.68*  --     Estimated Creatinine Clearance: 95.3 mL/min (by C-G formula based on SCr of 0.93 mg/dL).   Medical History: Past Medical History:  Diagnosis Date  . Hypertension     Medications:  Prescriptions Prior to Admission  Medication Sig Dispense Refill Last Dose  . aspirin EC 81 MG tablet Take 81 mg by mouth daily.   09/18/2016 at Unknown time  . docusate sodium (COLACE) 100 MG capsule Take 100 mg by mouth 2 (two) times daily.   09/18/2016 at Unknown time  . ibuprofen (ADVIL,MOTRIN) 200 MG tablet Take 600 mg by mouth every 6 (six) hours as needed for moderate pain.   09/18/2016 at Unknown time  . ketorolac (TORADOL) 10 MG tablet Take 10 mg by mouth every 6 (six) hours as needed for severe pain.   09/18/2016 at Unknown time  . warfarin (COUMADIN) 3 MG tablet Take 3 mg by mouth daily. Take 4.5 mg by mouth daily. Pt takes 3mg  by mouth every morning and 1.5 mg by mouth every evening.   09/18/2016 at 0900  . amoxicillin-clavulanate (AUGMENTIN) 875-125 MG tablet Take 1 tablet by mouth every 12 (twelve) hours. (Patient not taking: Reported on 09/18/2016) 14 tablet 0 Completed Course at Unknown time    Assessment: 31 yo male presents with  chest discomfort. 6 weeks ago he had replacement of native diseased mitral valve, congenital, that was "leaking".  On coumadin PTA. INR 1.5 and pharmacy consulted for heparin. Today the Heparin level 0.65 on heparin 900 units/hr.  Heparin level drawn early, only 4 hours after bolus and drip started this AM. I will recheck level later today to confirm remains therapeutic.   INR = 1.91, increase from 1.5. Coumadin 5 mg given last night. (also 3mg  taken 5/9 AM pta) for total 8mg .  PTA warfarin of 4.5mg  daily (takes 3mg  in the am and 1.5mg  in the pm), last pta dose taken 5/9 @ 0900 (3mg ).  CBC low stable H/H and pltc wnl. No bleeding reported.  Cardiologist notes patient may need LHC tomorrow.   Goal of Therapy:  INR 2-3 Heparin level 0.3-0.7 units/ml Monitor platelets by anticoagulation protocol: Yes   Plan:  Continue IV heparin infusion at 900 units/hr Recheck 6 hr heparin level Coumadin 4 mg today x1  Daily Heparin level and CBC Continue to monitor H&H and platelets   Thank you for allowing pharmacy to be part of this patients care team. Noah Delaineuth Shanise Balch, RPh  Clinical Pharmacist Pager: [phone]-[phone] [phone]-10P [phone] Main Pharmacy [phone] 09/19/2016,1:36 PM

## 2016-09-19 NOTE — Progress Notes (Signed)
  Echocardiogram 2D Echocardiogram has been performed.  Arvil ChacoFoster, Inetha Maret 09/19/2016, 11:44 AM

## 2016-09-19 NOTE — Progress Notes (Signed)
Spoke with patient and mom in the room. He complains of persistent chronic pain. Troponin rose and fell, more consistent with ACS, however, records indicate he just had a cath prior to valve replacement with normal coronaries. INR was subtherapeutic on admission - he was cutting back warfarin dose because he didn't want to run out of medication. Now on IV heparin. [facility] personally [place] [facility] [place] [facility] [place] He has a history of ostium primum repair in 1998 and mitral, tricuspid and aortic insufficiency. He was noted to have severe mitral and tricuspid insufficiency and had surgery on 07/29/2016. 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 He also had TV annuloplasty with a 28 mm Medtronic Contour 3D ring. He underwent right heart cath on 05/31/2016 which demonstrated mild pulmonary hypertension, but I do not see LHC results.   Plan echo today- sharp mechanical valve sounds on exam. Ok for diet today - keep NPO p MN. May need LHC tomorrow.  Chrystie NoseKenneth C. Laqueisha Catalina, MD, [facility]caFACC  [place]  [facility]  Attending Cardiologist  Direct Dial: [phone]  Fax: [phone]  Website:  [email or web address]

## 2016-09-19 NOTE — Progress Notes (Signed)
Patient has been placed on the add-on cath board for left heart catheterization tomorrow. Clear liquid diet tomorrow morning, then NPO.   Roe Rutherfordngela Nicole Duke, PA-C 09/19/2016, 4:45 PM [phone]

## 2016-09-19 NOTE — Progress Notes (Signed)
ANTICOAGULATION CONSULT NOTE - follow up  Pharmacy Consult for heparin Indication: mechanical valve  No Known Allergies  Patient Measurements: Height: 5\' 7"  (170.2 cm) Weight: 130 lb 3.2 oz (59.1 kg) IBW/kg (Calculated) : 66.1 Heparin Dosing Weight: 59 Kg  Vital Signs: Temp: 97.7 F (36.5 C) (05/10 1737) Temp Source: Oral (05/10 1737) BP: 112/77 (05/10 1737) Pulse Rate: 111 (05/10 1737)  Labs:  Recent Labs  09/18/16 1431 09/18/16 1512  09/19/16 0711 09/19/16 1214 09/19/16 1820  HGB 12.7*  --   --  11.1* 11.5*  --   HCT 38.4*  --   --  34.8* 34.8*  --   PLT 414*  --   --  326 350  --   LABPROT  --  18.2*  --   --  22.2*  --   INR  --  1.50  --   --  1.91  --   HEPARINUNFRC  --   --   --   --  0.65 0.36  CREATININE 0.92  --   --  0.93  --   --   TROPONINI  --   --   < > 0.68* 0.52* 0.52*  < > = values in this interval not displayed.  Estimated Creatinine Clearance: 95.3 mL/min (by C-G formula based on SCr of 0.93 mg/dL).   Medical History: Past Medical History:  Diagnosis Date  . Chest pain 09/19/2016  . Hypertension     Medications:  Prescriptions Prior to Admission  Medication Sig Dispense Refill Last Dose  . aspirin EC 81 MG tablet Take 81 mg by mouth daily.   09/18/2016 at Unknown time  . docusate sodium (COLACE) 100 MG capsule Take 100 mg by mouth 2 (two) times daily.   09/18/2016 at Unknown time  . ibuprofen (ADVIL,MOTRIN) 200 MG tablet Take 600 mg by mouth every 6 (six) hours as needed for moderate pain.   09/18/2016 at Unknown time  . ketorolac (TORADOL) 10 MG tablet Take 10 mg by mouth every 6 (six) hours as needed for severe pain.   09/18/2016 at Unknown time  . warfarin (COUMADIN) 3 MG tablet Take 3 mg by mouth daily. Take 4.5 mg by mouth daily. Pt takes 3mg  by mouth every morning and 1.5 mg by mouth every evening.   09/18/2016 at 0900  . amoxicillin-clavulanate (AUGMENTIN) 875-125 MG tablet Take 1 tablet by mouth every 12 (twelve) hours. (Patient not taking:  Reported on 09/18/2016) 14 tablet 0 Completed Course at Unknown time    Assessment: 31 yo male presents with chest discomfort. 6 weeks ago he had replacement of native diseased mitral valve, congenital, that was "leaking". On coumadin PTA. INR 1.5 and pharmacy consulted for heparin.  Heparin level 0.36, remains therapeutic on 900 units/hr. INR 1.9, Plan for cath tomorrow, hold coumadin tonight per Dr. Blanchie DessertHilty's order.  Goal of Therapy:  INR 2-3 Heparin level 0.3-0.7 units/ml Monitor platelets by anticoagulation protocol: Yes   Plan:  Continue IV heparin infusion at 900 units/hr Daily Heparin level and CBC Continue to monitor H&H and platelets f/u restarting coumadin after cath.    Bayard HuggerMei Lezlee Gills, PharmD, BCPS  Clinical Pharmacist  Pager: [phone]   09/19/2016,7:47 PM

## 2016-09-19 NOTE — Progress Notes (Signed)
CRITICAL VALUE ALERT  Critical value received:  tropnin  Date of notification:  09/19/16  Time of notification:  0730  Critical value read back:yes  Nurse who received alert:  Revonda StandardAllison   MD notified (1st page): Cardiology PA  Time of first page:  0730  MD notified (2nd page):  Time of second page:  Responding MD:  Cardiology PA Time MD responded: PA in the room at the time of phone call.

## 2016-09-19 NOTE — H&P (Signed)
History & Physical    Patient ID: Robert Mcmahon MRN: [id number], DOB/AGE: 11/12/1985   Admit date: 09/18/2016   Primary Physician: Patient, No Pcp Per Primary Cardiologist: None   History of Present Illness    Robert Mcmahon is a 31 y.o. 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    Robert Mcmahon says yesterday while walking out of the [employer] building, he started noticing chest pain that was throughout his chest.  He states the pain as being constant, not going to any other location, sharp and burning, and not made worse or better by anything.  His initial pain was a 7/10 in intensity and it is now 5/10 in intensity.  He reports similar chest pain about a week and half ago for which he says he was diagnosed with pneumonia at that time.  Robert Mcmahon reports being put on antibiotics after going to the emergency department in April with a cough, runny nose, and sore throat.  He took all his antibiotics as prescribed and his initial chest pain did go away.  He does feel short of breath when he is having chest pain, but he denies other symptoms such as nausea, lightheadedness, dizziness, or palpitations.  Patient says he sometimes wake up in the middle of the night due to shortness of breath with last episode two days ago, but he denies swelling in his legs, or problems with laying flat due to shortness of breath.  Of note, he reports having surgery on his left valve about 6 weeks ago in [place].  He does recall having a cardiac catheterization performed prior and says he was not told that he had coronary artery disease.  He denies any complications after his surgery.  Patient says that he did have his initial surgery to repair his valve when he was 31 years old.  Of note, he is  currently deciding to stay in [place] or move back to [place].  Past Medical History    Past Medical History:  Diagnosis Date  . Hypertension     Past Surgical History:  Procedure Laterality Date  . CARDIAC SURGERY       Allergies  No Known Allergies   Home Medications    Prior to Admission medications   Medication Sig Start Date End Date Taking? Authorizing Provider  aspirin EC 81 MG tablet Take 81 mg by mouth daily.   Yes [provider]  docusate sodium (COLACE) 100 MG capsule Take 100 mg by mouth 2 (two) times daily.   Yes [provider]  ibuprofen (ADVIL,MOTRIN) 200 MG tablet Take 600 mg by mouth every 6 (six) hours as needed for moderate pain.   Yes [provider]  ketorolac (TORADOL) 10 MG tablet Take 10 mg by mouth every 6 (six) hours as needed for severe pain.   Yes [provider]  warfarin (COUMADIN) 3 MG tablet Take 3 mg by mouth daily. Take 4.5 mg by mouth daily. Pt takes 3mg  by mouth every morning and 1.5 mg by mouth every evening.   Yes [provider]  amoxicillin-clavulanate (AUGMENTIN) 875-125 MG tablet Take 1 tablet by mouth every 12 (twelve) hours. Patient not taking: Reported on 09/18/2016 09/09/16   Lorre Nick, MD    Family History  No family history on file.  Social History    Social History   Social History  . Marital status: Single    Spouse name: N/A  . Number of children: N/A  . Years of education: N/A   Occupational History  . Not on file.   Social History Main Topics  . Smoking status: Never Smoker  . Smokeless tobacco: Never Used  . Alcohol use No  . Drug use: No  . Sexual activity: Not on file   Other Topics Concern  . Not on file   Social History Narrative  . No narrative on file     Review of Systems    General:  No chills, fever, night sweats or weight changes.  Cardiovascular:  No chest pain, dyspnea on exertion, edema, orthopnea, palpitations, paroxysmal  nocturnal dyspnea. Dermatological: No rash, lesions/masses Respiratory: No cough, dyspnea Urologic: No hematuria, dysuria Abdominal:   No nausea, vomiting, diarrhea, bright red blood per rectum, melena, or hematemesis Neurologic:  No visual changes, wkns, changes in mental status. All other systems reviewed and are otherwise negative except as noted above.  Physical Exam    Blood pressure (!) 117/58, pulse 89, temperature 97.8 F (36.6 C), temperature source Oral, resp. rate 16, height 5\' 7"  (1.702 m), weight 59.1 kg (130 lb 3.2 oz), SpO2 100 %.  General: Well developed, well nourished,male in no acute distress. Head: Normocephalic, atraumatic, sclera non-icteric, no xanthomas, nares are without discharge. Dentition:  Neck: No carotid bruits. JVD not elevated.  Lungs: Respirations regular and unlabored, without wheezes or rales.  Heart: Regular rate and rhythm. No S3 or S4.  No murmur, no rubs, or gallops appreciated. Abdomen: Soft, non-tender, non-distended with normoactive bowel sounds. No hepatomegaly. No rebound/guarding. No obvious abdominal masses. Msk:  Strength and tone appear normal for age. No joint deformities or effusions. Extremities: No clubbing or cyanosis. No edema.  Distal pedal pulses are 2+ bilaterally. Neuro: Alert and oriented X 3. Moves all extremities spontaneously. No focal deficits noted. Psych:  Responds to questions appropriately with a normal affect. Skin: No rashes or lesions noted  Labs    Troponin [facility] Test)  Recent Labs  09/18/16 1920  TROPIPOC 0.91*    Recent Labs  09/18/16 2028  TROPONINI 1.09*   Lab Results  Component Value Date   WBC 9.2 09/18/2016   HGB 12.7 (L) 09/18/2016   HCT 38.4 (L) 09/18/2016   MCV 87.3 09/18/2016   PLT 414 (H) 09/18/2016    Recent Labs Lab 09/18/16 1431  NA 138  K 3.7  CL 102  CO2 29  BUN 15  CREATININE 0.92  CALCIUM 9.9  GLUCOSE 114*   No results found for: CHOL, HDL, LDLCALC, TRIG No  results found for: DDIMER  No results found for: BNP No results found for: PROBNP  Recent Labs  09/18/16 1512  INR 1.50      Radiology Studies    Dg Chest 2 View  Result Date: 09/18/2016 CLINICAL DATA:  Chest pain EXAM: CHEST  2 VIEW COMPARISON:  09/09/2016 FINDINGS: Cardiac shadow is within normal limits. Postsurgical changes are seen. No focal infiltrate or sizable effusion is seen. No bony abnormality is noted. IMPRESSION: No acute abnormality noted. Stable postoperative changes. Electronically Signed   By: Alcide Clever M.D.   On: 09/18/2016 15:29   Dg Chest 2 View  Result Date: 09/09/2016 CLINICAL DATA:  Status post open heart surgery 6 weeks ago. Onset of malaise and fever and new left-sided  chest discomfort 4 days ago. EXAM: CHEST  2 VIEW COMPARISON:  None. FINDINGS: The lungs are well-expanded. There is patchy increased density in the right perihilar region. Minimal similar changes are noted on the left. The heart is normal in size. The pulmonary vascularity is not engorged. There is no pleural effusion. The sternal wires appear intact. There are prosthetic cardiac valve rings in the mitral and tricuspid positions. The retrosternal soft tissues appear normal. The bony thorax exhibits no acute abnormality. IMPRESSION: Patchy increased perihilar density greatest on the right but to a lesser extent on the left may reflect atelectasis or early pneumonia. No overt CHF. Electronically Signed   By: David  SwazilandJordan M.D.   On: 09/09/2016 08:03    EKG & Cardiac Imaging    EKG:  09/19/16 (02:55):  NSR; left axis deviation  ECHOCARDIOGRAM: none documented  Assessment & Plan    Active Problems:   Chest pain   H/O mitral valve replacement   Dyspnea   H/O tricuspid valve repair  # Chest pain Unclear etiology of chest pain in the setting of recent open heart surgery to replace his mitral valve with tricuspid repair and recent treatment for pneumonia and elevated troponin levels.  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  Based on age, lack of traditional risk factors for cardiovascular disease, and recent cardiac catheterization that did not require a coronary artery intervention, suspicion for acute coronary syndrome is low.  However, patient could be suffering from coronary artery dissection.  Low-suspicion for non-coronary artery related issues such as pulmonary embolism at this time. - Will arrange for an echocardiogram. - Patient will be started on heparin since sub-therapeutic on warfarin, providing anticoagulation to cover potential acute coronary syndrome as other diagnostics come back. - Continue aspirin. - Will schedule Ibuprofen with colchicine at this time.  Morphine as needed.   - Will need to arrange for records on cardiac catheterization to be sent for further evaluation.   - Continue to trend troponin levels for now.    # Status post mitral valve replacement (mechanical)/tricuspid annuloplasty:   Patient status post redo of mitral valve with mechanical valve replacement in the setting mitral regurgitation and tricuspid valve annuloplasty for tricuspid regurgitation.  He is approximately 6 weeks post surgery (documentation in chart).  Patient is sub-therapeutic on warfarin at this time. Currently having chest pain for unclear etiology at this time. - Start heparin drip at this time due to sub-therapeutic INR. - Pharmacy consult for heparin. - Continue aspirin.   - Repeat echocardiogram.    # Prophylaxis - Heparin drip.  Signed, Judie GrieveKamal D Henderson, MD 09/19/2016, 7:06 AM

## 2016-09-19 NOTE — Progress Notes (Signed)
ANTICOAGULATION CONSULT NOTE - Initial Consult  Pharmacy Consult for heparin Indication: mechanical valve  No Known Allergies  Patient Measurements: Height: 5\' 7"  (170.2 cm) Weight: 130 lb 3.2 oz (59.1 kg) IBW/kg (Calculated) : 66.1 Heparin Dosing Weight: 59 Kg  Vital Signs: Temp: 97.8 F (36.6 C) (05/10 0542) Temp Source: Oral (05/10 0542) BP: 117/58 (05/10 0542) Pulse Rate: 89 (05/10 0542)  Labs:  Recent Labs  09/18/16 1431 09/18/16 1512 09/18/16 2028  HGB 12.7*  --   --   HCT 38.4*  --   --   PLT 414*  --   --   LABPROT  --  18.2*  --   INR  --  1.50  --   CREATININE 0.92  --   --   TROPONINI  --   --  1.09*    Estimated Creatinine Clearance: 96.4 mL/min (by C-G formula based on SCr of 0.92 mg/dL).   Medical History: Past Medical History:  Diagnosis Date  . Hypertension     Medications:  Prescriptions Prior to Admission  Medication Sig Dispense Refill Last Dose  . aspirin EC 81 MG tablet Take 81 mg by mouth daily.   09/18/2016 at Unknown time  . docusate sodium (COLACE) 100 MG capsule Take 100 mg by mouth 2 (two) times daily.   09/18/2016 at Unknown time  . ibuprofen (ADVIL,MOTRIN) 200 MG tablet Take 600 mg by mouth every 6 (six) hours as needed for moderate pain.   09/18/2016 at Unknown time  . ketorolac (TORADOL) 10 MG tablet Take 10 mg by mouth every 6 (six) hours as needed for severe pain.   09/18/2016 at Unknown time  . warfarin (COUMADIN) 3 MG tablet Take 3 mg by mouth daily. Take 4.5 mg by mouth daily. Pt takes 3mg  by mouth every morning and 1.5 mg by mouth every evening.   09/18/2016 at 0900  . amoxicillin-clavulanate (AUGMENTIN) 875-125 MG tablet Take 1 tablet by mouth every 12 (twelve) hours. (Patient not taking: Reported on 09/18/2016) 14 tablet 0 Completed Course at Unknown time    Assessment: [age] yo male presents with chest discomfort. 6 weeks ago he had replacement of native diseased mitral valve, congenital, that was "leaking". INR 1.5 and pharmacy  consulted for heparin. CBC stable  PTA warfarin of 4.5mg  daily (takes 3mg  in the am and 1.5mg  in the pm)  Goal of Therapy:  INR 2-3 Heparin level 0.3-0.7 units/ml Monitor platelets by anticoagulation protocol: Yes   Plan:  Give 2500 units bolus x 1 Start heparin infusion at 900 units/hr Check anti-Xa level in 6 hours and daily while on heparin Continue to monitor H&H and platelets  Habiba Treloar L Analeya Luallen 09/19/2016,7:26 AM

## 2016-09-19 NOTE — Progress Notes (Addendum)
Discussed echo findings with the patient and his mother. Normal LVEF with normal mechanical MV function. D-dimer negative, so doubt PE. DDx includes NSTEMI - ACS, ?coronary embolus from mitral valve when INR subtherapeutic. [facility] - I could only find right heart cath results - he remembers a heart cath, but I suspect it was a right heart only - since we would not normally expect CAD in a young person going for valve surgery.   I'm recommending left heart catheterization. INR 1.9 at noon today - will hold PM dose today and let it drift back down. Continue IV heparin. Ok for radial cath with INR <1.7. He would be scheduled as an add-on - likely late in the day tomorrow. If coronaries ok, then restart warfarin tomorrow night - goal INR 2.5-3.5. Would recommend IV heparin bridge until therapeutic.  I discussed the risks, benefits and alternatives to cardiac catheterization with him and he is agreeable to proceed. Clear liquids for breakfast tomorrow then NPO.  Chrystie NoseKenneth C. Demetra Moya, MD, [facility] Surgery CenterFACC  [place]  [facility]  Attending Cardiologist  Direct Dial: [phone]  Fax: [phone]  Website:  [email or web address]

## 2016-09-20 ENCOUNTER — Encounter (HOSPITAL_COMMUNITY)
Admission: EM | Disposition: A | Discharge status: Home / Self Care | Payer: Self-pay | Source: Home / Self Care | Attending: Internal Medicine

## 2016-09-20 DIAGNOSIS — R0602 Shortness of breath: Secondary | ICD-10-CM

## 2016-09-20 HISTORY — PX: LEFT HEART CATH AND CORONARY ANGIOGRAPHY: CATH118249

## 2016-09-20 LAB — PROTIME-INR
INR: 2.01
INR: 1.73
PROTHROMBIN TIME: 23.1 s — AB (ref 11.4–15.2)
PROTHROMBIN TIME: 20.5 s — AB (ref 11.4–15.2)

## 2016-09-20 LAB — CBC
HCT: 33.7 % — ABNORMAL LOW (ref 39.0–52.0)
HEMOGLOBIN: 11 g/dL — AB (ref 13.0–17.0)
MCH: 28.9 pg (ref 26.0–34.0)
MCHC: 32.6 g/dL (ref 30.0–36.0)
MCV: 88.5 fL (ref 78.0–100.0)
Platelets: 311 10*3/uL (ref 150–400)
RBC: 3.81 MIL/uL — AB (ref 4.22–5.81)
RDW: 13.4 % (ref 11.5–15.5)
WBC: 5.2 10*3/uL (ref 4.0–10.5)

## 2016-09-20 LAB — BASIC METABOLIC PANEL
Anion gap: 7 (ref 5–15)
BUN: 11 mg/dL (ref 6–20)
CALCIUM: 8.7 mg/dL — AB (ref 8.9–10.3)
CO2: 25 mmol/L (ref 22–32)
CREATININE: 0.93 mg/dL (ref 0.61–1.24)
Chloride: 105 mmol/L (ref 101–111)
GFR calc Af Amer: >60 mL/min (ref >60)
GFR calc non Af Amer: >60 mL/min (ref >60)
GLUCOSE: 99 mg/dL (ref 65–99)
Potassium: 3.9 mmol/L (ref 3.5–5.1)
SODIUM: 137 mmol/L (ref 135–145)

## 2016-09-20 LAB — HEPARIN LEVEL (UNFRACTIONATED): HEPARIN UNFRACTIONATED: 0.44 [IU]/mL (ref 0.30–0.70)

## 2016-09-20 SURGERY — LEFT HEART CATH AND CORONARY ANGIOGRAPHY
Anesthesia: LOCAL

## 2016-09-20 MED ORDER — ACETAMINOPHEN 325 MG PO TABS: 650.0000 mg | ORAL_TABLET | ORAL | Status: DC | PRN

## 2016-09-20 MED ORDER — FENTANYL CITRATE (PF) 100 MCG/2ML IJ SOLN
INTRAMUSCULAR | Status: AC
  Filled 2016-09-20: qty 2

## 2016-09-20 MED ORDER — SODIUM CHLORIDE 0.9% FLUSH
3.0000 mL | Freq: Two times a day (BID) | INTRAVENOUS | Status: DC
  Administered 2016-09-20: 3 mL via INTRAVENOUS

## 2016-09-20 MED ORDER — HEPARIN (PORCINE) IN NACL 2-0.9 UNIT/ML-% IJ SOLN
INTRAMUSCULAR | Status: AC
  Filled 2016-09-20: qty 1000

## 2016-09-20 MED ORDER — ASPIRIN 81 MG PO CHEW
81.0000 mg | CHEWABLE_TABLET | ORAL | Status: AC
  Administered 2016-09-20: 81 mg via ORAL
  Filled 2016-09-20: qty 1

## 2016-09-20 MED ORDER — VERAPAMIL HCL 2.5 MG/ML IV SOLN
INTRAVENOUS | Status: AC
  Filled 2016-09-20: qty 2

## 2016-09-20 MED ORDER — HEPARIN (PORCINE) IN NACL 2-0.9 UNIT/ML-% IJ SOLN
INTRAMUSCULAR | Status: AC | PRN
  Administered 2016-09-20: 1000 mL

## 2016-09-20 MED ORDER — HEPARIN (PORCINE) IN NACL 100-0.45 UNIT/ML-% IJ SOLN
950.0000 [IU]/h | INTRAMUSCULAR | Status: DC
  Administered 2016-09-21: 1050 [IU]/h via INTRAVENOUS
  Administered 2016-09-21: 900 [IU]/h via INTRAVENOUS
  Filled 2016-09-20: qty 250

## 2016-09-20 MED ORDER — SODIUM CHLORIDE 0.9% FLUSH: 3.0000 mL | INTRAVENOUS | Status: DC | PRN

## 2016-09-20 MED ORDER — SODIUM CHLORIDE 0.9 % IV SOLN
INTRAVENOUS | Status: AC
  Administered 2016-09-20: 20:00:00 via INTRAVENOUS

## 2016-09-20 MED ORDER — VERAPAMIL HCL 2.5 MG/ML IV SOLN
INTRAVENOUS | Status: DC | PRN
  Administered 2016-09-20 (×2): via INTRA_ARTERIAL

## 2016-09-20 MED ORDER — MIDAZOLAM HCL 2 MG/2ML IJ SOLN
INTRAMUSCULAR | Status: DC | PRN
  Administered 2016-09-20 (×2): 1 mg via INTRAVENOUS

## 2016-09-20 MED ORDER — SODIUM CHLORIDE 0.9% FLUSH
3.0000 mL | Freq: Two times a day (BID) | INTRAVENOUS | Status: DC
Start: 2016-09-21 — End: 2016-09-22
  Administered 2016-09-20 – 2016-09-21 (×3): 3 mL via INTRAVENOUS

## 2016-09-20 MED ORDER — SODIUM CHLORIDE 0.9 % IV SOLN: 250.0000 mL | INTRAVENOUS | Status: DC | PRN

## 2016-09-20 MED ORDER — MIDAZOLAM HCL 2 MG/2ML IJ SOLN
INTRAMUSCULAR | Status: AC
  Filled 2016-09-20: qty 2

## 2016-09-20 MED ORDER — IOPAMIDOL (ISOVUE-370) INJECTION 76%
INTRAVENOUS | Status: DC | PRN
  Administered 2016-09-20: 110 mL via INTRA_ARTERIAL

## 2016-09-20 MED ORDER — LIDOCAINE HCL (PF) 1 % IJ SOLN
INTRAMUSCULAR | Status: DC | PRN
Start: 2016-09-20 — End: 2016-09-20
  Administered 2016-09-20: 2 mL

## 2016-09-20 MED ORDER — OXYCODONE-ACETAMINOPHEN 5-325 MG PO TABS
1.0000 | ORAL_TABLET | ORAL | Status: DC | PRN
  Administered 2016-09-21: 2 via ORAL
  Filled 2016-09-20: qty 2

## 2016-09-20 MED ORDER — FENTANYL CITRATE (PF) 100 MCG/2ML IJ SOLN
INTRAMUSCULAR | Status: DC | PRN
  Administered 2016-09-20 (×2): 50 ug via INTRAVENOUS

## 2016-09-20 MED ORDER — HEPARIN SODIUM (PORCINE) 1000 UNIT/ML IJ SOLN
INTRAMUSCULAR | Status: DC | PRN
  Administered 2016-09-20: 2000 [IU] via INTRAVENOUS

## 2016-09-20 MED ORDER — IOPAMIDOL (ISOVUE-370) INJECTION 76%
INTRAVENOUS | Status: AC
  Filled 2016-09-20: qty 100

## 2016-09-20 MED ORDER — HEPARIN SODIUM (PORCINE) 1000 UNIT/ML IJ SOLN
INTRAMUSCULAR | Status: AC
  Filled 2016-09-20: qty 1

## 2016-09-20 MED ORDER — ONDANSETRON HCL 4 MG/2ML IJ SOLN: 4.0000 mg | Freq: Four times a day (QID) | INTRAMUSCULAR | Status: DC | PRN

## 2016-09-20 MED ORDER — SODIUM CHLORIDE 0.9 % WEIGHT BASED INFUSION
3.0000 mL/kg/h | INTRAVENOUS | Status: DC
  Administered 2016-09-20: 3 mL/kg/h via INTRAVENOUS

## 2016-09-20 MED ORDER — SODIUM CHLORIDE 0.9 % WEIGHT BASED INFUSION: 1.0000 mL/kg/h | INTRAVENOUS | Status: DC

## 2016-09-20 MED ORDER — LIDOCAINE HCL (PF) 1 % IJ SOLN
INTRAMUSCULAR | Status: AC
  Filled 2016-09-20: qty 30

## 2016-09-20 MED ORDER — WARFARIN SODIUM 5 MG PO TABS
6.0000 mg | ORAL_TABLET | Freq: Once | ORAL | Status: AC
  Administered 2016-09-20: 6 mg via ORAL
  Filled 2016-09-20: qty 1

## 2016-09-20 SURGICAL SUPPLY — 15 items
CATH INFINITI 5 FR JL3.5 (CATHETERS) ×1 IMPLANT
CATH INFINITI 5FR JL4 (CATHETERS) ×1 IMPLANT
CATH INFINITI JR4 5F (CATHETERS) ×1 IMPLANT
CATH LAUNCHER 5F JL3 (CATHETERS) IMPLANT
CATHETER LAUNCHER 5F JL3 (CATHETERS) ×2
COVER PRB 48X5XTLSCP FOLD TPE (BAG) IMPLANT
COVER PROBE 5X48 (BAG) ×2
DEVICE RAD COMP TR BAND LRG (VASCULAR PRODUCTS) ×1 IMPLANT
GLIDESHEATH SLEND A-KIT 6F 22G (SHEATH) ×1 IMPLANT
GUIDEWIRE INQWIRE 1.5J.035X260 (WIRE) IMPLANT
INQWIRE 1.5J .035X260CM (WIRE) ×2
KIT HEART LEFT (KITS) ×2 IMPLANT
PACK CARDIAC CATHETERIZATION (CUSTOM PROCEDURE TRAY) ×2 IMPLANT
TRANSDUCER W/STOPCOCK (MISCELLANEOUS) ×2 IMPLANT
TUBING CIL FLEX 10 FLL-RA (TUBING) ×2 IMPLANT

## 2016-09-20 NOTE — Progress Notes (Signed)
ANTICOAGULATION CONSULT NOTE - follow up  Pharmacy Consult for heparin Indication: mechanical valve  No Known Allergies  Patient Measurements: Height: 5\' 7"  (170.2 cm) Weight: 130 lb (59 kg) IBW/kg (Calculated) : 66.1 Heparin Dosing Weight: 59 Kg  Vital Signs: Temp: 97.7 F (36.5 C) (05/11 0900) Temp Source: Oral (05/11 0900) BP: 131/83 (05/11 0900) Pulse Rate: 87 (05/11 0900)  Labs:  Recent Labs  09/18/16 1431  09/19/16 0711 09/19/16 1214 09/19/16 1820 09/20/16 0407 09/20/16 1331  HGB 12.7*  --  11.1* 11.5*  --  11.0*  --   HCT 38.4*  --  34.8* 34.8*  --  33.7*  --   PLT 414*  --  326 350  --  311  --   LABPROT  --   < >  --  22.2*  --  23.1* 20.5*  INR  --   < >  --  1.91  --  2.01 1.73  HEPARINUNFRC  --   --   --  0.65 0.36 0.44  --   CREATININE 0.92  --  0.93  --   --  0.93  --   TROPONINI  --   < > 0.68* 0.52* 0.52*  --   --   < > = values in this interval not displayed.  Estimated Creatinine Clearance: 95.2 mL/min (by C-G formula based on SCr of 0.93 mg/dL).   Medical History: Past Medical History:  Diagnosis Date  . Chest pain 09/19/2016  . Hypertension     Medications:  Prescriptions Prior to Admission  Medication Sig Dispense Refill Last Dose  . aspirin EC 81 MG tablet Take 81 mg by mouth daily.   09/18/2016 at Unknown time  . docusate sodium (COLACE) 100 MG capsule Take 100 mg by mouth 2 (two) times daily.   09/18/2016 at Unknown time  . ibuprofen (ADVIL,MOTRIN) 200 MG tablet Take 600 mg by mouth every 6 (six) hours as needed for moderate pain.   09/18/2016 at Unknown time  . ketorolac (TORADOL) 10 MG tablet Take 10 mg by mouth every 6 (six) hours as needed for severe pain.   09/18/2016 at Unknown time  . warfarin (COUMADIN) 3 MG tablet Take 3 mg by mouth daily. Take 4.5 mg by mouth daily. Pt takes 3mg  by mouth every morning and 1.5 mg by mouth every evening.   09/18/2016 at 0900  . amoxicillin-clavulanate (AUGMENTIN) 875-125 MG tablet Take 1 tablet by mouth  every 12 (twelve) hours. (Patient not taking: Reported on 09/18/2016) 14 tablet 0 Completed Course at Unknown time    Assessment: 31 yo male presents with chest discomfort. 6 weeks ago he had replacement of native diseased mitral valve, congenital, that was "leaking". On coumadin PTA. INR 1.5  Subtherapeutic on admit date 09/18/16. Coumadin 5 mg po was given on 09/18/16.  INR remained low on 5/10 thus pharmacy consulted for heparin IV drip.  Plan for cath thus coumadin held last night per Dr. Blanchie Dessert order. This AM , INR = 2.01 and repeated INR this afternoon @13 :30  = 1.73  Heparin level 0.44, remains therapeutic x3  on 900 units/hr. CBC low/stable, pltc wnl. No bleeding noted. F/u on plan for cath.   Goal of Therapy:  INR 2-3 Heparin level 0.3-0.7 units/ml Monitor platelets by anticoagulation protocol: Yes   Plan:  Continue IV heparin infusion at 900 units/hr Daily Heparin level and CBC Continue to monitor H&H and platelets f/u restarting coumadin after cath.    Noah Delaine, RPh Clinical Pharmacist  Pager: [phone] #[phone] #[phone] [facility] [phone] 09/20/2016,2:41 PM

## 2016-09-20 NOTE — Interval H&P Note (Signed)
Cath Lab Visit (complete for each Cath Lab visit)  Clinical Evaluation Leading to the Procedure:   ACS: No.  Non-ACS:    Anginal Classification: No Symptoms  Anti-ischemic medical therapy: No Therapy  Non-Invasive Test Results: No non-invasive testing performed  Prior CABG: No previous CABG      History and Physical Interval Note:  09/20/2016 7:03 PM  Robert Mcmahon  has presented today for surgery, with the diagnosis of cp  The various methods of treatment have been discussed with the patient and family. After consideration of risks, benefits and other options for treatment, the patient has consented to  Procedure(s): Left Heart Cath and Coronary Angiography (N/A) as a surgical intervention .  The patient's history has been reviewed, patient examined, no change in status, stable for surgery.  I have reviewed the patient's chart and labs.  Questions were answered to the patient's satisfaction.     [name]

## 2016-09-20 NOTE — Progress Notes (Signed)
ANTICOAGULATION CONSULT NOTE - follow up  Pharmacy Consult for coumadin Indication: mechanical valve  No Known Allergies  Patient Measurements: Height: 5\' 7"  (170.2 cm) Weight: 130 lb (59 kg) IBW/kg (Calculated) : 66.1 Heparin Dosing Weight: 59 Kg  Vital Signs: Temp: 97.7 F (36.5 C) (05/11 0900) Temp Source: Oral (05/11 0900) BP: 119/73 (05/11 1953) Pulse Rate: 0 (05/11 1958)  Labs:  Recent Labs  09/18/16 1431  09/19/16 0711 09/19/16 1214 09/19/16 1820 09/20/16 0407 09/20/16 1331  HGB 12.7*  --  11.1* 11.5*  --  11.0*  --   HCT 38.4*  --  34.8* 34.8*  --  33.7*  --   PLT 414*  --  326 350  --  311  --   LABPROT  --   < >  --  22.2*  --  23.1* 20.5*  INR  --   < >  --  1.91  --  2.01 1.73  HEPARINUNFRC  --   --   --  0.65 0.36 0.44  --   CREATININE 0.92  --  0.93  --   --  0.93  --   TROPONINI  --   < > 0.68* 0.52* 0.52*  --   --   < > = values in this interval not displayed.  Estimated Creatinine Clearance: 95.2 mL/min (by C-G formula based on SCr of 0.93 mg/dL).   Medical History: Past Medical History:  Diagnosis Date  . Chest pain 09/19/2016  . Hypertension     Medications:  Prescriptions Prior to Admission  Medication Sig Dispense Refill Last Dose  . aspirin EC 81 MG tablet Take 81 mg by mouth daily.   09/18/2016 at Unknown time  . docusate sodium (COLACE) 100 MG capsule Take 100 mg by mouth 2 (two) times daily.   09/18/2016 at Unknown time  . ibuprofen (ADVIL,MOTRIN) 200 MG tablet Take 600 mg by mouth every 6 (six) hours as needed for moderate pain.   09/18/2016 at Unknown time  . ketorolac (TORADOL) 10 MG tablet Take 10 mg by mouth every 6 (six) hours as needed for severe pain.   09/18/2016 at Unknown time  . warfarin (COUMADIN) 3 MG tablet Take 3 mg by mouth daily. Take 4.5 mg by mouth daily. Pt takes 3mg  by mouth every morning and 1.5 mg by mouth every evening.   09/18/2016 at 0900  . amoxicillin-clavulanate (AUGMENTIN) 875-125 MG tablet Take 1 tablet by mouth  every 12 (twelve) hours. (Patient not taking: Reported on 09/18/2016) 14 tablet 0 Completed Course at Unknown time    Assessment: 31 yo male presents with chest discomfort. 6 weeks ago he had replacement of native diseased mitral valve on coumadin PTA. She is now s/p cath with no obstructive coronary disease. Pharmacy consulted to resume coumadin and heparin (resume heparin 8 hours post sheath; removed ~ 8pm) -INR= 1.73  Coumadin dose PTA: 4.5mg /day  Goal of Therapy:  INR 2-3 Heparin level 0.3-0.7 units/ml Monitor platelets by anticoagulation protocol: Yes   Plan:  -resume heparin at 900 units/hr at 4am -Heparin level in 6 hours and daily wth CBC daily -Coumadin 6mg  po today -Daily PT/INR  Harland GermanAndrew Dasie Chancellor, Pharm D 09/20/2016 8:28 PM

## 2016-09-20 NOTE — H&P (View-Only) (Signed)
 Progress Note  Patient Name: Robert Mcmahon Date of Encounter: 09/20/2016  Primary Cardiologist: new - Dr. Hilty  Subjective   Patient is feeling well; denies chest pain, SOB, and palpitations. He is finishing a liquid breakfast and will remain NPO for possible left heart cath today.  Inpatient Medications    Scheduled Meds: . aspirin EC  81 mg Oral Daily  . colchicine  0.6 mg Oral BID  . docusate sodium  100 mg Oral BID  . ibuprofen  600 mg Oral TID  . sodium chloride flush  3 mL Intravenous Q12H  . Warfarin - Pharmacist Dosing Inpatient   Does not apply q1800   Continuous Infusions: . sodium chloride 10 mL/hr at 09/18/16 1610  . sodium chloride 100 mL/hr (09/20/16 0347)  . sodium chloride    . sodium chloride 1 mL/kg/hr (09/20/16 0746)  . heparin 900 Units/kg/hr (09/20/16 0348)   PRN Meds: sodium chloride, acetaminophen, ketorolac, morphine injection, nitroGLYCERIN, ondansetron (ZOFRAN) IV, sodium chloride flush   Vital Signs    Vitals:   09/19/16 1737 09/19/16 1907 09/20/16 0100 09/20/16 0500  BP: 112/77 105/68 115/71 123/87  Pulse: (!) 111 (!) 101 80 78  Resp: 18 18 17 16  Temp: 97.7 F (36.5 C) 98.6 F (37 C) 98.1 F (36.7 C) 98.3 F (36.8 C)  TempSrc: Oral Oral Oral Oral  SpO2:    100%  Weight:    130 lb (59 kg)  Height:        Intake/Output Summary (Last 24 hours) at 09/20/16 0751 Last data filed at 09/20/16 0639  Gross per 24 hour  Intake             1758 ml  Output                0 ml  Net             1758 ml   Filed Weights   09/18/16 1430 09/19/16 0300 09/20/16 0500  Weight: 132 lb (59.9 kg) 130 lb 3.2 oz (59.1 kg) 130 lb (59 kg)     Physical Exam   General: Well developed, well nourished, male appearing in no acute distress. Head: Normocephalic, atraumatic.  Neck: Supple without bruits, no JVD Lungs:  Resp regular and unlabored, CTA. Heart: RRR, S1, S2, no S3, S4, mitral valve click, no rub. Abdomen: Soft, non-tender, non-distended  with normoactive bowel sounds. No hepatomegaly. No rebound/guarding. No obvious abdominal masses. Extremities: No clubbing, cyanosis, no edema. Distal pedal pulses are 2+ bilaterally. Neuro: Alert and oriented X 3. Moves all extremities spontaneously. Psych: Normal affect.  Labs    Chemistry Recent Labs Lab 09/18/16 1431 09/19/16 0711 09/20/16 0407  NA 138 137 137  K 3.7 3.9 3.9  CL 102 103 105  CO2 29 25 25  GLUCOSE 114* 89 99  BUN 15 10 11  CREATININE 0.92 0.93 0.93  CALCIUM 9.9 9.2 8.7*  GFRNONAA >60 >60 >60  GFRAA >60 >60 >60  ANIONGAP 7 9 7     Hematology Recent Labs Lab 09/19/16 0711 09/19/16 1214 09/20/16 0407  WBC 6.8 7.2 5.2  RBC 4.00* 3.98* 3.81*  HGB 11.1* 11.5* 11.0*  HCT 34.8* 34.8* 33.7*  MCV 87.0 87.4 88.5  MCH 27.8 28.9 28.9  MCHC 31.9 33.0 32.6  RDW 13.0 13.0 13.4  PLT 326 350 311    Cardiac Enzymes Recent Labs Lab 09/18/16 2028 09/19/16 0711 09/19/16 1214 09/19/16 1820  TROPONINI 1.09* 0.68* 0.52* 0.52*    Recent   Labs Lab 09/18/16 1518 09/18/16 1920  TROPIPOC 0.44* 0.91*     BNPNo results for input(s): BNP, PROBNP in the last 168 hours.   DDimer  Recent Labs Lab 09/19/16 0739  DDIMER 0.32     Radiology    Dg Chest 2 View  Result Date: 09/18/2016 CLINICAL DATA:  Chest pain EXAM: CHEST  2 VIEW COMPARISON:  09/09/2016 FINDINGS: Cardiac shadow is within normal limits. Postsurgical changes are seen. No focal infiltrate or sizable effusion is seen. No bony abnormality is noted. IMPRESSION: No acute abnormality noted. Stable postoperative changes. Electronically Signed   By: Mark  Lukens M.D.   On: 09/18/2016 15:29     Telemetry    NSR - Personally Reviewed  ECG    No new tracings - Personally Reviewed   Cardiac Studies   Left heart cath: pending  Echocardiogram 09/19/16 Study Conclusions - Left ventricle: The cavity size was normal. Systolic function was   normal. The estimated ejection fraction was in the range of  55%   to 60%. The study is not technically sufficient to allow   evaluation of LV diastolic function. - Aortic valve: There was moderate regurgitation. - Mitral valve: A mechanical prosthesis was present. The prosthesis   had a normal range of motion. Valve area by continuity equation   (using LVOT flow): 1.42 cm^2. - Atrial septum: Prior repair of ASD.   Patient Profile     31 y.o. 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   Assessment & Plan    1. 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 Hypertension - no home medications - BP controlled, sBO 100-120s    Signed, Cloy Cozzens Nicole Lashane Whelpley , PA-C 7:51 AM 09/20/2016 Pager: [phone]  

## 2016-09-20 NOTE — Progress Notes (Signed)
Patient c/o chest pain 6/10 relieved by Morphine. Will continue to monitor.

## 2016-09-20 NOTE — Progress Notes (Signed)
Progress Note  Patient Name: Robert SchatzCalvin Gravely Date of Encounter: 09/20/2016  Primary Cardiologist: new - Dr. Rennis GoldenHilty  Subjective   Patient is feeling well; denies chest pain, SOB, and palpitations. He is finishing a liquid breakfast and will remain NPO for possible left heart cath today.  Inpatient Medications    Scheduled Meds: . aspirin EC  81 mg Oral Daily  . colchicine  0.6 mg Oral BID  . docusate sodium  100 mg Oral BID  . ibuprofen  600 mg Oral TID  . sodium chloride flush  3 mL Intravenous Q12H  . Warfarin - Pharmacist Dosing Inpatient   Does not apply q1800   Continuous Infusions: . sodium chloride 10 mL/hr at 09/18/16 1610  . sodium chloride 100 mL/hr (09/20/16 0347)  . sodium chloride    . sodium chloride 1 mL/kg/hr (09/20/16 0746)  . heparin 900 Units/kg/hr (09/20/16 0348)   PRN Meds: sodium chloride, acetaminophen, ketorolac, morphine injection, nitroGLYCERIN, ondansetron (ZOFRAN) IV, sodium chloride flush   Vital Signs    Vitals:   09/19/16 1737 09/19/16 1907 09/20/16 0100 09/20/16 0500  BP: 112/77 105/68 115/71 123/87  Pulse: (!) 111 (!) 101 80 78  Resp: 18 18 17 16   Temp: 97.7 F (36.5 C) 98.6 F (37 C) 98.1 F (36.7 C) 98.3 F (36.8 C)  TempSrc: Oral Oral Oral Oral  SpO2:    100%  Weight:    130 lb (59 kg)  Height:        Intake/Output Summary (Last 24 hours) at 09/20/16 0751 Last data filed at 09/20/16 [id number]  Gross per 24 hour  Intake             1758 ml  Output                0 ml  Net             1758 ml   Filed Weights   09/18/16 1430 09/19/16 0300 09/20/16 0500  Weight: 132 lb (59.9 kg) 130 lb 3.2 oz (59.1 kg) 130 lb (59 kg)     Physical Exam   General: Well developed, well nourished, male appearing in no acute distress. Head: Normocephalic, atraumatic.  Neck: Supple without bruits, no JVD Lungs:  Resp regular and unlabored, CTA. Heart: RRR, S1, S2, no S3, S4, mitral valve click, no rub. Abdomen: Soft, non-tender, non-distended  with normoactive bowel sounds. No hepatomegaly. No rebound/guarding. No obvious abdominal masses. Extremities: No clubbing, cyanosis, no edema. Distal pedal pulses are 2+ bilaterally. Neuro: Alert and oriented X 3. Moves all extremities spontaneously. Psych: Normal affect.  Labs    Chemistry Recent Labs Lab 09/18/16 1431 09/19/16 0711 09/20/16 0407  NA 138 137 137  K 3.7 3.9 3.9  CL 102 103 105  CO2 29 25 25   GLUCOSE 114* 89 99  BUN 15 10 11   CREATININE 0.92 0.93 0.93  CALCIUM 9.9 9.2 8.7*  GFRNONAA >60 >60 >60  GFRAA >60 >60 >60  ANIONGAP 7 9 7      Hematology Recent Labs Lab 09/19/16 0711 09/19/16 1214 09/20/16 0407  WBC 6.8 7.2 5.2  RBC 4.00* 3.98* 3.81*  HGB 11.1* 11.5* 11.0*  HCT 34.8* 34.8* 33.7*  MCV 87.0 87.4 88.5  MCH 27.8 28.9 28.9  MCHC 31.9 33.0 32.6  RDW 13.0 13.0 13.4  PLT 326 350 311    Cardiac Enzymes Recent Labs Lab 09/18/16 2028 09/19/16 0711 09/19/16 1214 09/19/16 1820  TROPONINI 1.09* 0.68* 0.52* 0.52*    Recent  Labs Lab 09/18/16 1518 09/18/16 1920  TROPIPOC 0.44* 0.91*     BNPNo results for input(s): BNP, PROBNP in the last 168 hours.   DDimer  Recent Labs Lab 09/19/16 0739  DDIMER 0.32     Radiology    Dg Chest 2 View  Result Date: 09/18/2016 CLINICAL DATA:  Chest pain EXAM: CHEST  2 VIEW COMPARISON:  09/09/2016 FINDINGS: Cardiac shadow is within normal limits. Postsurgical changes are seen. No focal infiltrate or sizable effusion is seen. No bony abnormality is noted. IMPRESSION: No acute abnormality noted. Stable postoperative changes. Electronically Signed   By: Alcide Clever M.D.   On: 09/18/2016 15:29     Telemetry    NSR - Personally Reviewed  ECG    No new tracings - Personally Reviewed   Cardiac Studies   Left heart cath: pending  Echocardiogram 09/19/16 Study Conclusions - Left ventricle: The cavity size was normal. Systolic function was   normal. The estimated ejection fraction was in the range of  55%   to 60%. The study is not technically sufficient to allow   evaluation of LV diastolic function. - Aortic valve: There was moderate regurgitation. - Mitral valve: A mechanical prosthesis was present. The prosthesis   had a normal range of motion. Valve area by continuity equation   (using LVOT flow): 1.42 cm^2. - Atrial septum: Prior repair of ASD.   Patient Profile     31 y.o. 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   Assessment & Plan    1. 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 Hypertension - no home medications - BP controlled, sBO 100-120s    Signed, Marcelino Duster , PA-C 7:51 AM 09/20/2016 Pager: [phone]

## 2016-09-21 LAB — HEPARIN LEVEL (UNFRACTIONATED)
HEPARIN UNFRACTIONATED: 0.19 [IU]/mL — AB (ref 0.30–0.70)
Heparin Unfractionated: 0.43 IU/mL (ref 0.30–0.70)

## 2016-09-21 LAB — BASIC METABOLIC PANEL
ANION GAP: 7 (ref 5–15)
BUN: 7 mg/dL (ref 6–20)
CO2: 24 mmol/L (ref 22–32)
Calcium: 8.7 mg/dL — ABNORMAL LOW (ref 8.9–10.3)
Chloride: 106 mmol/L (ref 101–111)
Creatinine, Ser: 0.84 mg/dL (ref 0.61–1.24)
GFR calc Af Amer: >60 mL/min (ref >60)
GLUCOSE: 87 mg/dL (ref 65–99)
POTASSIUM: 3.6 mmol/L (ref 3.5–5.1)
SODIUM: 137 mmol/L (ref 135–145)

## 2016-09-21 LAB — PROTIME-INR
INR: 1.58
Prothrombin Time: 19 seconds — ABNORMAL HIGH (ref 11.4–15.2)

## 2016-09-21 MED ORDER — WARFARIN SODIUM 2.5 MG PO TABS
4.5000 mg | ORAL_TABLET | Freq: Once | ORAL | Status: AC
  Administered 2016-09-21: 4.5 mg via ORAL
  Filled 2016-09-21: qty 1

## 2016-09-21 MED ORDER — HEPARIN BOLUS VIA INFUSION
1000.0000 [IU] | Freq: Once | INTRAVENOUS | Status: AC
  Administered 2016-09-21: 1000 [IU] via INTRAVENOUS
  Filled 2016-09-21: qty 1000

## 2016-09-21 NOTE — Progress Notes (Signed)
ANTICOAGULATION CONSULT NOTE - follow up  Pharmacy Consult for heparin/coumadin Indication: mechanical valve  No Known Allergies  Patient Measurements: Height: 5\' 7"  (170.2 cm) Weight: 134 lb 8 oz (61 kg) IBW/kg (Calculated) : 66.1 Heparin Dosing Weight: 59 Kg  Vital Signs: Temp: 98 F (36.7 C) (05/12 0821) Temp Source: Oral (05/12 0821) BP: 120/74 (05/12 0821) Pulse Rate: 84 (05/12 0500)  Labs:  Recent Labs  09/19/16 0711  09/19/16 1214 09/19/16 1820 09/20/16 0407 09/20/16 1331 09/21/16 0428 09/21/16 1022  HGB 11.1*  --  11.5*  --  11.0*  --   --   --   HCT 34.8*  --  34.8*  --  33.7*  --   --   --   PLT 326  --  350  --  311  --   --   --   LABPROT  --   --  22.2*  --  23.1* 20.5* 19.0*  --   INR  --   --  1.91  --  2.01 1.73 1.58  --   HEPARINUNFRC  --   < > 0.65 0.36 0.44  --   --  0.19*  CREATININE 0.93  --   --   --  0.93  --  0.84  --   TROPONINI 0.68*  --  0.52* 0.52*  --   --   --   --   < > = values in this interval not displayed.  Estimated Creatinine Clearance: 108.9 mL/min (by C-G formula based on SCr of 0.84 mg/dL).   Medical History: Past Medical History:  Diagnosis Date  . Chest pain 09/19/2016  . Hypertension     Medications:  Prescriptions Prior to Admission  Medication Sig Dispense Refill Last Dose  . aspirin EC 81 MG tablet Take 81 mg by mouth daily.   09/18/2016 at Unknown time  . docusate sodium (COLACE) 100 MG capsule Take 100 mg by mouth 2 (two) times daily.   09/18/2016 at Unknown time  . ibuprofen (ADVIL,MOTRIN) 200 MG tablet Take 600 mg by mouth every 6 (six) hours as needed for moderate pain.   09/18/2016 at Unknown time  . ketorolac (TORADOL) 10 MG tablet Take 10 mg by mouth every 6 (six) hours as needed for severe pain.   09/18/2016 at Unknown time  . warfarin (COUMADIN) 3 MG tablet Take 3 mg by mouth daily. Take 4.5 mg by mouth daily. Pt takes 3mg  by mouth every morning and 1.5 mg by mouth every evening.   09/18/2016 at 0900  .  amoxicillin-clavulanate (AUGMENTIN) 875-125 MG tablet Take 1 tablet by mouth every 12 (twelve) hours. (Patient not taking: Reported on 09/18/2016) 14 tablet 0 Completed Course at Unknown time    Assessment: 31 yo male presents with chest discomfort. Six weeks ago he had replacement of native diseased mitral valve on coumadin PTA and is now s/p cath with no obstructive coronary disease found. Pharmacy consulted to resume coumadin and heparin on 5/11. INR subtherapeutic at 1.58. Heparin level also slightly subtherapeutic at 0.19. Per RN, no interruption in heparin overnight. CBC stable, no s/s bleeding.   Coumadin dose PTA: 4.5mg /day  Goal of Therapy:  INR 2-3 Heparin level 0.3-0.7 units/ml Monitor platelets by anticoagulation protocol: Yes   Plan:  -Heparin bolus 1000 units IV x1  -Increase heparin to 1050 units/hr  -Heparin level in 6 hours  -Heparin level, INR, and CBC daily -Coumadin 4.5 mg po today -Monitor for s/s bleeding   [name], PharmD  Pharmacy Resident  Pager [phone] 09/21/16 9:06 AM

## 2016-09-21 NOTE — Discharge Instructions (Addendum)

## 2016-09-21 NOTE — Progress Notes (Signed)
Subjective:  Tolerated catheterization well yesterday, coronary arteries appeared patent.  INR still subtherapeutic.  Objective:  Vital Signs in the last 24 hours: BP 120/74 (BP Location: Left Arm)   Pulse 84   Temp 98 F (36.7 C) (Oral)   Resp 17   Ht 5\' 7"  (1.702 m)   Wt 61 kg (134 lb 8 oz)   SpO2 98%   BMI 21.07 kg/m   Physical Exam: Pleasant thin black male in no acute distress Lungs:  Clear Cardiac:  Regular rhythm, normal S1 and S2, no S3, valve clicks well heard Extremities:  No edema present  Intake/Output from previous day: 05/11 0701 - 05/12 0700 In: 2971.2 [P.O.:840; I.V.:2131.2] Out: 1400 [Urine:1400]  Weight Filed Weights   09/19/16 0300 09/20/16 0500 09/21/16 0500  Weight: 59.1 kg (130 lb 3.2 oz) 59 kg (130 lb) 61 kg (134 lb 8 oz)    Lab Results: Basic Metabolic Panel:  Recent Labs  78/29/5604/03/30 0407 09/21/16 0428  NA 137 137  K 3.9 3.6  CL 105 106  CO2 25 24  GLUCOSE 99 87  BUN 11 7  CREATININE 0.93 0.84   CBC:  Recent Labs  09/19/16 0711 09/19/16 1214 09/20/16 0407  WBC 6.8 7.2 5.2  NEUTROABS 4.3  --   --   HGB 11.1* 11.5* 11.0*  HCT 34.8* 34.8* 33.7*  MCV 87.0 87.4 88.5  PLT 326 350 311   Cardiac Enzymes: Troponin (Point of Care Test)  Recent Labs  09/18/16 1920  TROPIPOC 0.91*   Cardiac Panel (last 3 results)  Recent Labs  09/19/16 0711 09/19/16 1214 09/19/16 1820  TROPONINI 0.68* 0.52* 0.52*    Telemetry: Sinus rhythm with occasional PACs, personally reviewed  Assessment/Plan:  1.  Prosthetic mechanical mitral valve  2.  Long-term anticoagulation currently subtherapeutic on heparin 3.  Mild elevation of troponin with patent coronary arteries noted  Recommendations:  Continue heparin until INR greater than 2.     W. Ashley RoyaltySpencer Sulema Braid, Jr.  MD [facility] Cardiology  09/21/2016, 10:58 AM

## 2016-09-21 NOTE — Progress Notes (Signed)
ANTICOAGULATION CONSULT NOTE - Follow Up Consult  Pharmacy Consult for Heparin Indication: Mech MV  No Known Allergies  Patient Measurements: Height: 5\' 7"  (170.2 cm) Weight: 134 lb 8 oz (61 kg) IBW/kg (Calculated) : 66.1 Heparin Dosing Weight:   Vital Signs: Temp: 98.4 F (36.9 C) (05/12 1638) Temp Source: Oral (05/12 1638) BP: 138/85 (05/12 1638)  Labs:  Recent Labs  09/19/16 0711  09/19/16 1214 09/19/16 1820 09/20/16 0407 09/20/16 1331 09/21/16 0428 09/21/16 1022 09/21/16 1855  HGB 11.1*  --  11.5*  --  11.0*  --   --   --   --   HCT 34.8*  --  34.8*  --  33.7*  --   --   --   --   PLT 326  --  350  --  311  --   --   --   --   LABPROT  --   < > 22.2*  --  23.1* 20.5* 19.0*  --   --   INR  --   < > 1.91  --  2.01 1.73 1.58  --   --   HEPARINUNFRC  --   < > 0.65 0.36 0.44  --   --  0.19* 0.43  CREATININE 0.93  --   --   --  0.93  --  0.84  --   --   TROPONINI 0.68*  --  0.52* 0.52*  --   --   --   --   --   < > = values in this interval not displayed.  Estimated Creatinine Clearance: 108.9 mL/min (by C-G formula based on SCr of 0.84 mg/dL).  Assessment:  Anticoag: Hep/Coum for mechanical MV with INR only 1.58. Heparin level 0.45 now in goal range.  Goal of Therapy:  Heparin level 0.3-0.7 units/ml Monitor platelets by anticoagulation protocol: Yes   Plan:  - Continue IV heparin at 1050 units/hr -Heparin level, INR, and CBC daily   Traylon Schimming S. Merilynn Finlandobertson, PharmD, BCPS Clinical Staff Pharmacist Pager [phone]  Misty Stanleyobertson, Maurisio Ruddy Stillinger 09/21/2016,7:39 PM

## 2016-09-22 LAB — CBC
HCT: 30.1 % — ABNORMAL LOW (ref 39.0–52.0)
Hemoglobin: 10 g/dL — ABNORMAL LOW (ref 13.0–17.0)
MCH: 29.6 pg (ref 26.0–34.0)
MCHC: 33.2 g/dL (ref 30.0–36.0)
MCV: 89.1 fL (ref 78.0–100.0)
PLATELETS: 263 10*3/uL (ref 150–400)
RBC: 3.38 MIL/uL — ABNORMAL LOW (ref 4.22–5.81)
RDW: 13.4 % (ref 11.5–15.5)
WBC: 4.5 10*3/uL (ref 4.0–10.5)

## 2016-09-22 LAB — PROTIME-INR
INR: 2.01
Prothrombin Time: 23.1 seconds — ABNORMAL HIGH (ref 11.4–15.2)

## 2016-09-22 LAB — HEPARIN LEVEL (UNFRACTIONATED): HEPARIN UNFRACTIONATED: 0.73 [IU]/mL — AB (ref 0.30–0.70)

## 2016-09-22 MED ORDER — WARFARIN SODIUM 2.5 MG PO TABS: 4.5000 mg | ORAL_TABLET | Freq: Once | ORAL | Status: DC

## 2016-09-22 MED ORDER — WARFARIN SODIUM 3 MG PO TABS: 5.0000 mg | ORAL_TABLET | Freq: Every day | ORAL | 3 refills | Status: DC

## 2016-09-22 MED ORDER — COLCHICINE 0.6 MG PO TABS: 0.6000 mg | ORAL_TABLET | Freq: Two times a day (BID) | ORAL | 3 refills | Status: DC

## 2016-09-22 NOTE — Discharge Summary (Signed)
Discharge Summary    Patient ID: Robert Mcmahon,  MRN: [id number], DOB/AGE: 32-29-1987 32 y.o.  Admit date: 09/18/2016 Discharge date: 09/22/2016  Primary Care Provider: Patient, No Pcp Per Primary Cardiologist: Hilty  Discharge Diagnoses    Principal Problem:   Elevated troponin Active Problems:   Chest pain   H/O mitral valve replacement   Dyspnea   H/O tricuspid valve repair   Allergies No Known Allergies  Diagnostic Studies/Procedures    Cardiac Catheterization 09/20/2016    LV end diastolic pressure is normal.    Very difficult procedure from the left radial due to small radial artery and short aortic arch. No right radial is palpable. Unable to perform from femoral approach due to INR of 1.75 earlier this afternoon.  Left main could not be selectively engaged, however subselective imaging demonstrated a widely patent left main, proximal to distal LAD, and proximal to mid circumflex.  The right coronary is dominant and normal.  No evidence of proximal coronary obstruction. Cannot exclude distal embolic event from the patient's valve with this study.  RECOMMENDATIONS:   Resume Coumadin  Reason for elevated cardiac markers is unclear. No evidence of obstructive coronary disease.   Echocardiogram 09/19/2016 Left ventricle: The cavity size was normal. Systolic function was   normal. The estimated ejection fraction was in the range of 55%   to 60%. The study is not technically sufficient to allow   evaluation of LV diastolic function. - Aortic valve: There was moderate regurgitation. - Mitral valve: A mechanical prosthesis was present. The prosthesis   had a normal range of motion. Valve area by continuity equation   (using LVOT flow): 1.42 cm^2. - Atrial septum: Prior repair of ASD. _____________   History of Present Illness     Robert Mcmahon is a 31 year old male patient of Robert Mcmahon who is complaining of chest pain shortness of breath and palpitations.  As result of this he was scheduled for cardiac catheterization. Patient has a history of mitral valve replacement with St. Jude mechanical valve, and tricuspid valve angioplasty in [place].   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 The suspicion for ACS was low. However ongoing symptom, also lack of documentation of recent catheterization which was breathing completed in [place], cardiac catheterization was planned. Breathing is he was taken off of Coumadin, continued on IV heparin until INR became subtherapeutic  Cardiac catheterization was completed in 09/20/2016: Angiography revealed widely patent left main, proximal to distal LAD, proximal to mid circumflex, the right coronary artery was dominant and normal. The patient was resumed on Coumadin.   Echocardiogram was completed on 09/19/2016 revealing normal LV systolic function, unable to evaluate LV diastolic function. Mechanical prosthesis was present at the mitral valve and had normal range of motion movement. Valve area by continuity equation 1.42 cm2  On day of discharge the patient was seen and examined by Robert Mcmahon and found to be stable. He had no further complaints of dyspnea or chest discomfort. INR was 2.1. The patient was anxious to return home. He will follow-up appointment with Robert Mcmahon, and a appointment with our Coumadin clinic next week with close follow-up post discharge.  Coumadin dosing was increased from 4.5 mg daily to 5 mg daily.   Discharge Vitals Blood pressure 125/79, pulse 74, temperature 97.8 F (36.6 C), temperature source Oral, resp. rate 18, height 5\' 7"  (  1.702 m), weight 129 lb 1.6 oz (58.6 kg), SpO2 99 %.  Filed Weights   09/20/16 0500 09/21/16 0500 09/22/16 0651  Weight: 130 lb (59 kg) 134 lb 8 oz (61 kg) 129 lb 1.6 oz (58.6 kg)    Labs & Radiologic Studies     CBC  Recent  Labs  09/20/16 0407 09/22/16 0451  WBC 5.2 4.5  HGB 11.0* 10.0*  HCT 33.7* 30.1*  MCV 88.5 89.1  PLT 311 263   Basic Metabolic Panel  Recent Labs  09/20/16 0407 09/21/16 0428  NA 137 137  K 3.9 3.6  CL 105 106  CO2 25 24  GLUCOSE 99 87  BUN 11 7  CREATININE 0.93 0.84  CALCIUM 8.7* 8.7*   Cardiac Enzymes  Recent Labs  09/19/16 1214 09/19/16 1820  TROPONINI 0.52* 0.52*   Dg Chest 2 View  Result Date: 09/18/2016 CLINICAL DATA:  Chest pain EXAM: CHEST  2 VIEW COMPARISON:  09/09/2016 FINDINGS: Cardiac shadow is within normal limits. Postsurgical changes are seen. No focal infiltrate or sizable effusion is seen. No bony abnormality is noted. IMPRESSION: No acute abnormality noted. Stable postoperative changes. Electronically Signed   By: Alcide Clever M.D.   On: 09/18/2016 15:29   Dg Chest 2 View  Result Date: 09/09/2016 CLINICAL DATA:  Status post open heart surgery 6 weeks ago. Onset of malaise and fever and new left-sided chest discomfort 4 days ago. EXAM: CHEST  2 VIEW COMPARISON:  None. FINDINGS: The lungs are well-expanded. There is patchy increased density in the right perihilar region. Minimal similar changes are noted on the left. The heart is normal in size. The pulmonary vascularity is not engorged. There is no pleural effusion. The sternal wires appear intact. There are prosthetic cardiac valve rings in the mitral and tricuspid positions. The retrosternal soft tissues appear normal. The bony thorax exhibits no acute abnormality. IMPRESSION: Patchy increased perihilar density greatest on the right but to a lesser extent on the left may reflect atelectasis or early pneumonia. No overt CHF. Electronically Signed   By: David  [place] M.D.   On: 09/09/2016 08:03    Disposition   Pt is being discharged home today in good condition.  Follow-up Plans & Appointments    Follow-up Information    Hilty, Lisette Abu, MD Follow up.   Specialty:  Cardiology Why:  Our office will  call you for appointment  Contact information: [address] [place] [address] [phone]        Caren Hazy, MD .   Specialty:  Neurology Contact information: [address] [place] [address] [phone]        [facility] [address] Office Follow up.   Specialty:  Cardiology Why:  Our office will call you for appointment for next week Contact information: [address], [address] [place] [address] [phone]         Discharge Instructions    Call MD for:  difficulty breathing, headache or visual disturbances    Complete by:  As directed    Call MD for:  persistant dizziness or light-headedness    Complete by:  As directed    Call MD for:  persistant nausea and vomiting    Complete by:  As directed    Call MD for:  redness, tenderness, or signs of infection (pain, swelling, redness, odor or green/yellow discharge around incision site)    Complete by:  As directed  Call MD for:  severe uncontrolled pain    Complete by:  As directed    Diet - low sodium heart healthy    Complete by:  As directed    Increase activity slowly    Complete by:  As directed       Discharge Medications   Current Discharge Medication List    START taking these medications   Details  colchicine 0.6 MG tablet Take 1 tablet (0.6 mg total) by mouth 2 (two) times daily. Qty: 60 tablet, Refills: 3      CONTINUE these medications which have CHANGED   Details  warfarin (COUMADIN) 3 MG tablet Take 1.5 tablets (4.5 mg total) by mouth daily. Take 4.5 mg by mouth daily. Pt takes 3mg  by mouth every morning and 1.5 mg by mouth every evening. Qty: 30 tablet, Refills: 3      CONTINUE these medications which have NOT CHANGED   Details  aspirin EC 81 MG tablet Take 81 mg by mouth daily.    docusate sodium (COLACE) 100 MG capsule Take 100 mg by mouth 2 (two) times daily.    ibuprofen (ADVIL,MOTRIN) 200 MG tablet  Take 600 mg by mouth every 6 (six) hours as needed for moderate pain.    ketorolac (TORADOL) 10 MG tablet Take 10 mg by mouth every 6 (six) hours as needed for severe pain.      STOP taking these medications     amoxicillin-clavulanate (AUGMENTIN) 875-125 MG tablet            Outstanding Labs/Studies     Duration of Discharge Encounter   Greater than 30 minutes including physician time.  Signed, Bettey MareKathryn M. Liborio NixonLawrence DNP, ANP, AACC   09/22/2016, 10:31 AM

## 2016-09-22 NOTE — Progress Notes (Signed)
Subjective:  Feels well today.  His INR is 2.1 today and he very much wishes to go home.  No shortness of breath or recurrent chest pain.   Objective:  Vital Signs in the last 24 hours: BP 125/79 (BP Location: Right Arm)   Pulse 74   Temp 97.8 F (36.6 C) (Oral)   Resp 18   Ht 5\' 7"  (1.702 m)   Wt 58.6 kg (129 lb 1.6 oz)   SpO2 99%   BMI 20.22 kg/m   Physical Exam: Pleasant thin black male in no acute distress Lungs:  Clear Cardiac:  Regular rhythm, normal S1 and S2, no S3, valve clicks well heard Extremities:  No edema present  Intake/Output from previous day: 05/12 0701 - 05/13 0700 In: 3179.9 [P.O.:1190; I.V.:1989.9] Out: 3900 [Urine:3900]  Weight Filed Weights   09/20/16 0500 09/21/16 0500 09/22/16 0651  Weight: 59 kg (130 lb) 61 kg (134 lb 8 oz) 58.6 kg (129 lb 1.6 oz)    Lab Results: Basic Metabolic Panel:  Recent Labs  16/02/9604/11/18 0407 09/21/16 0428  NA 137 137  K 3.9 3.6  CL 105 106  CO2 25 24  GLUCOSE 99 87  BUN 11 7  CREATININE 0.93 0.84   CBC:  Recent Labs  09/20/16 0407 09/22/16 0451  WBC 5.2 4.5  HGB 11.0* 10.0*  HCT 33.7* 30.1*  MCV 88.5 89.1  PLT 311 263   Cardiac Panel (last 3 results)  Recent Labs  09/19/16 1214 09/19/16 1820  TROPONINI 0.52* 0.52*    Telemetry: Sinus rhythm with occasional PACs, personally reviewed  Assessment/Plan:  1.  Prosthetic mechanical mitral valve  2.  Long-term anticoagulation currently With INR of 2.1  3.  Mild elevation of troponin with patent coronary arteries noted  Recommendations:  He would like to go home today and I think this would be fine.  He is new to the system and will need early follow-up in the anticoagulation clinic.  He thinks his previous warfarin dose was 4.5 mg daily and so wouldn't send out on 5 mg daily.  His target INR would be 2.5-3.5.  I think he is planning to stay [facility] so follow-up with Dr. Rennis GoldenHilty would be reasonable in one to 2 weeks.      Darden PalmerW. Spencer  Tilley, Jr.  MD [facility] Cardiology  09/22/2016, 8:30 AM

## 2016-09-22 NOTE — Progress Notes (Signed)
ANTICOAGULATION CONSULT NOTE - follow up  Pharmacy Consult for heparin/coumadin Indication: mechanical valve  No Known Allergies  Patient Measurements: Height: 5\' 7"  (170.2 cm) Weight: 129 lb 1.6 oz (58.6 kg) IBW/kg (Calculated) : 66.1 Heparin Dosing Weight: 59 Kg  Vital Signs: Temp: 98.4 F (36.9 C) (05/13 0651) Temp Source: Oral (05/13 0651) BP: 126/75 (05/13 0651) Pulse Rate: 72 (05/13 0651)  Labs:  Recent Labs  09/19/16 1214 09/19/16 1820 09/20/16 0407 09/20/16 1331 09/21/16 0428 09/21/16 1022 09/21/16 1855 09/22/16 0451  HGB 11.5*  --  11.0*  --   --   --   --  10.0*  HCT 34.8*  --  33.7*  --   --   --   --  30.1*  PLT 350  --  311  --   --   --   --  263  LABPROT 22.2*  --  23.1* 20.5* 19.0*  --   --  23.1*  INR 1.91  --  2.01 1.73 1.58  --   --  2.01  HEPARINUNFRC 0.65 0.36 0.44  --   --  0.19* 0.43 0.73*  CREATININE  --   --  0.93  --  0.84  --   --   --   TROPONINI 0.52* 0.52*  --   --   --   --   --   --     Estimated Creatinine Clearance: 104.6 mL/min (by C-G formula based on SCr of 0.84 mg/dL).   Medical History: Past Medical History:  Diagnosis Date  . Chest pain 09/19/2016  . Hypertension     Medications:  Prescriptions Prior to Admission  Medication Sig Dispense Refill Last Dose  . aspirin EC 81 MG tablet Take 81 mg by mouth daily.   09/18/2016 at Unknown time  . docusate sodium (COLACE) 100 MG capsule Take 100 mg by mouth 2 (two) times daily.   09/18/2016 at Unknown time  . ibuprofen (ADVIL,MOTRIN) 200 MG tablet Take 600 mg by mouth every 6 (six) hours as needed for moderate pain.   09/18/2016 at Unknown time  . ketorolac (TORADOL) 10 MG tablet Take 10 mg by mouth every 6 (six) hours as needed for severe pain.   09/18/2016 at Unknown time  . warfarin (COUMADIN) 3 MG tablet Take 3 mg by mouth daily. Take 4.5 mg by mouth daily. Pt takes 3mg  by mouth every morning and 1.5 mg by mouth every evening.   09/18/2016 at 0900  . amoxicillin-clavulanate  (AUGMENTIN) 875-125 MG tablet Take 1 tablet by mouth every 12 (twelve) hours. (Patient not taking: Reported on 09/18/2016) 14 tablet 0 Completed Course at Unknown time    Assessment: 31 yo male presents with chest discomfort. Six weeks ago he had replacement of native diseased mitral valve on coumadin PTA and is now s/p cath with no obstructive coronary disease found. Pharmacy consulted to resume coumadin and heparin on 5/11. INR 2.01 (per MD wants INR >2). Heparin level also slightly supratherapeutic at 0.73. CBC stable, no s/s bleeding.   Coumadin dose PTA: 4.5mg /day  Goal of Therapy:  INR 2-3 Heparin level 0.3-0.7 units/ml Monitor platelets by anticoagulation protocol: Yes   Plan:  -Decrease IV heparin to 950 units/hr -Heparin level, INR, andCBC daily -Coumadin 4.5 mg po today -Monitor for s/s bleeding  -F/u with MD to clarify INR goal    York CeriseKatherine Cook, PharmD Pharmacy Resident  Pager [phone] 09/22/16 8:01 AM

## 2016-09-22 NOTE — Progress Notes (Signed)
Spoke and clarified with pharmacist. Pt told to take 4.5 mg of coumadin tonight because he had not had any coumadin today.

## 2016-09-22 NOTE — Progress Notes (Signed)
Discharge instructions reviewed with pt. Pt has no questions at this time. Prescription given to pt. Pt stated he will call tomorrow to schedule an appointment with Dr. Rennis GoldenHilty. Pt stated he does not have a PCP here in [place]. [facility] called, pt given House call number so he can find a  PCP. Pt told the importance of telling the cardiologist and PCP that he is on coumadin so they can arrange for him to have his lab levels checked. Pt denies any pain and stated he is ready for discharge. Pt states he has transportation for his follow up appointment.

## 2016-09-23 ENCOUNTER — Encounter (HOSPITAL_COMMUNITY): Payer: Self-pay | Admitting: Interventional Cardiology

## 2016-09-27 ENCOUNTER — Ambulatory Visit (INDEPENDENT_AMBULATORY_CARE_PROVIDER_SITE_OTHER): Payer: BLUE CROSS/BLUE SHIELD | Admitting: Pharmacist

## 2016-09-27 DIAGNOSIS — Z952 Presence of prosthetic heart valve: Secondary | ICD-10-CM

## 2016-09-27 DIAGNOSIS — Z9889 Other specified postprocedural states: Secondary | ICD-10-CM | POA: Diagnosis not present

## 2016-09-27 LAB — POCT INR: INR: 2.3

## 2016-10-09 ENCOUNTER — Encounter: Payer: Self-pay | Admitting: Internal Medicine

## 2016-10-09 ENCOUNTER — Ambulatory Visit (INDEPENDENT_AMBULATORY_CARE_PROVIDER_SITE_OTHER): Payer: Medicaid Other | Admitting: Pharmacist Clinician (PhC)/ Clinical Pharmacy Specialist

## 2016-10-09 ENCOUNTER — Ambulatory Visit (INDEPENDENT_AMBULATORY_CARE_PROVIDER_SITE_OTHER): Payer: BLUE CROSS/BLUE SHIELD | Admitting: Internal Medicine

## 2016-10-09 VITALS — BP 122/86 | HR 77 | Ht 67.0 in | Wt 135.0 lb

## 2016-10-09 DIAGNOSIS — Z952 Presence of prosthetic heart valve: Secondary | ICD-10-CM

## 2016-10-09 DIAGNOSIS — Z9889 Other specified postprocedural states: Secondary | ICD-10-CM

## 2016-10-09 DIAGNOSIS — R748 Abnormal levels of other serum enzymes: Secondary | ICD-10-CM | POA: Diagnosis not present

## 2016-10-09 DIAGNOSIS — R7989 Other specified abnormal findings of blood chemistry: Secondary | ICD-10-CM

## 2016-10-09 DIAGNOSIS — R778 Other specified abnormalities of plasma proteins: Secondary | ICD-10-CM

## 2016-10-09 LAB — POCT INR: INR: 2.4

## 2016-10-09 MED ORDER — WARFARIN SODIUM 5 MG PO TABS: 5.0000 mg | ORAL_TABLET | Freq: Every day | ORAL | 0 refills | Status: DC

## 2016-10-09 NOTE — Patient Instructions (Signed)
Medication Instructions:   STOP ASPIRIN  Follow-Up:  Your physician wants you to follow-up in: ONE YEAR WITH DR HILTY You will receive a reminder letter in the mail two months in advance. If you don't receive a letter, please call our office to schedule the follow-up appointment.   If you need a refill on your cardiac medications before your next appointment, please call your pharmacy.

## 2016-10-09 NOTE — Progress Notes (Signed)
OFFICE FOLLOW-UP NOTE  Chief Complaint:  No complaints, hospital follow-up  Primary Care Physician: Patient, No Pcp Per  HPI:  Robert Mcmahon is a 31 y.o. 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 Jude Masters series mechanical mitral valve and concomitant tricuspid valve annuloplasty with a 28 mm Medtronic contour 3-D ring in [place]. He was transitioning to living in [place]. He presented to the hospital in May with chest pain and shortness of breath. He was found to have an elevated troponin which rose and fell consistent more with an ACS pattern. His INR was subtherapeutic on admission and I was concerned about possibly a coronary thrombus. Although he reported having had heart catheterization prior to valve surgery, we could only find records of his right heart catheterization. My experience is not unusual for young patient did not have left heart catheterization prior to valve surgery given the low likelihood of coronary disease. Based on these findings I thought it prudent for him to undergo left heart catheterization. He did have left heart catheterization with Dr. Katrinka BlazingSmith on 09/20/2016. This was noted to be difficult procedure from the radial approach. He was not found to have any obstructive coronary disease. He was transitioned back to warfarin and presents today for follow-up. Since discharge he said no further chest pain or worsening shortness of breath. INR most recently was therapeutic. INR today is at goal at 2.4. He says that he may stay in [place] for the summer but could transition back to [place]. He also recently saw his surgeon who is requesting records of this hospitalization.  PMHx:  Past Medical History:  Diagnosis Date  . Chest pain 09/19/2016  . Hypertension     Past Surgical History:  Procedure Laterality Date  . CARDIAC SURGERY  07/29/2016   mitral & tricuspid valve replacement  . LEFT HEART CATH AND CORONARY ANGIOGRAPHY N/A 09/20/2016   Procedure: Left Heart Cath and Coronary Angiography;  Surgeon: Lyn RecordsSmith, Henry W, MD;  [facility];  Service: Cardiovascular;  Laterality: N/A;    FAMHx:  Family History  Problem Relation Age of Onset  . Heart murmur Mother     SOCHx:   reports that he has never smoked. He has never used smokeless tobacco. He reports that he does not drink alcohol or use drugs.  ALLERGIES:  No Known Allergies  ROS: Pertinent items noted in HPI and remainder of comprehensive ROS otherwise negative.  HOME MEDS: Current Outpatient Prescriptions on File Prior to Visit  Medication Sig Dispense Refill  . colchicine 0.6 MG tablet Take 1 tablet (0.6 mg total) by mouth 2 (two) times daily. 60 tablet 3  . docusate sodium (COLACE) 100 MG capsule Take 100 mg by mouth 2 (two) times daily.    Marland Kitchen. ibuprofen (ADVIL,MOTRIN) 200 MG tablet Take 600 mg by mouth every 6 (six) hours as needed for moderate pain.    Marland Kitchen. ketorolac (TORADOL) 10 MG tablet Take 10 mg by mouth every 6 (six) hours as needed for severe pain.     No current facility-administered medications on file prior to visit.     LABS/IMAGING: Results for orders placed or performed in visit on 09/27/16 (from the past 48 hour(s))  POCT INR     Status: None   Collection Time: 10/09/16  9:08 AM  Result Value Ref Range   INR 2.4    No results  found.  LIPID PANEL: No results found for: CHOL, TRIG, HDL, CHOLHDL, VLDL, LDLCALC, LDLDIRECT   WEIGHTS: Wt Readings from Last 3 Encounters:  10/09/16 135 lb (61.2 kg)  09/22/16 129 lb 1.6 oz (58.6 kg)    VITALS: BP 122/86   Pulse 77   Ht 5\' 7"  (1.702 m)   Wt 135 lb (61.2 kg)   BMI 21.14 kg/m   EXAM: General appearance: alert, no distress and Thin Neck: no carotid bruit and no JVD Lungs: clear to auscultation bilaterally Heart: regular rate and rhythm, S1, S2 normal and ejection click  present Abdomen: soft, non-tender; bowel sounds normal; no masses,  no organomegaly Extremities: extremities normal, atraumatic, no cyanosis or edema Pulses: 2+ and symmetric Skin: Skin color, texture, turgor normal. No rashes or lesions Neurologic: Grossly normal Psych: Pleasant  EKG: Deferred  ASSESSMENT: 1. Elevated troponin-possibly related to transient coronary thrombus 2. 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 History of tricuspid regurgitation status post 28 mm Medtronic 3-D contour series tricuspid annuloplasty ring 4. Long-term anticoagulation on warfarin  PLAN: 1.   Mr. Olivera had elevated troponin in the setting of chest pain with normal coronaries. The etiology of his elevated troponin is unclear. His INR was low as thought that he may have had a thrombotic mechanism although he is mitral valve appeared to be functioning normally on echo. He was placed on heparin and transition back to warfarin. INR is now stable. Will go ahead and continue to prescribe warfarin and follow his INR checks in the office. He was additionally placed on aspirin however without a clear etiology I would recommend discontinuing that because of the higher than normal bleeding risk in addition to warfarin.  Follow-up routinely with the warfarin clinic in annually with me or sooner as necessary.  Chrystie Nose, MD, [facility]  [place]  [facility]  Attending Cardiologist  Direct Dial: [phone]  Fax: [phone]  Website:  [email or web address] Nicely Hilty 10/09/2016, 12:42 PM

## 2016-10-21 ENCOUNTER — Ambulatory Visit (INDEPENDENT_AMBULATORY_CARE_PROVIDER_SITE_OTHER): Payer: BLUE CROSS/BLUE SHIELD | Admitting: Pharmacist

## 2016-10-21 DIAGNOSIS — Z952 Presence of prosthetic heart valve: Secondary | ICD-10-CM | POA: Diagnosis not present

## 2016-10-21 DIAGNOSIS — Z9889 Other specified postprocedural states: Secondary | ICD-10-CM

## 2016-10-21 LAB — POCT INR: INR: 2.8

## 2016-11-18 ENCOUNTER — Ambulatory Visit (INDEPENDENT_AMBULATORY_CARE_PROVIDER_SITE_OTHER): Payer: BLUE CROSS/BLUE SHIELD | Admitting: Pharmacist

## 2016-11-18 DIAGNOSIS — Z952 Presence of prosthetic heart valve: Secondary | ICD-10-CM

## 2016-11-18 DIAGNOSIS — Z9889 Other specified postprocedural states: Secondary | ICD-10-CM

## 2016-11-18 LAB — POCT INR: INR: 3.2

## 2016-12-16 ENCOUNTER — Ambulatory Visit (INDEPENDENT_AMBULATORY_CARE_PROVIDER_SITE_OTHER): Payer: Medicaid Other | Admitting: Pharmacist Clinician (PhC)/ Clinical Pharmacy Specialist

## 2016-12-16 ENCOUNTER — Other Ambulatory Visit: Payer: Self-pay | Admitting: Pharmacist Clinician (PhC)/ Clinical Pharmacy Specialist

## 2016-12-16 DIAGNOSIS — Z9889 Other specified postprocedural states: Secondary | ICD-10-CM | POA: Diagnosis not present

## 2016-12-16 DIAGNOSIS — Z952 Presence of prosthetic heart valve: Secondary | ICD-10-CM | POA: Diagnosis not present

## 2016-12-16 LAB — POCT INR: INR: 1.8

## 2016-12-16 MED ORDER — WARFARIN SODIUM 5 MG PO TABS: ORAL_TABLET | ORAL | 0 refills | Status: DC

## 2016-12-22 ENCOUNTER — Encounter (HOSPITAL_COMMUNITY): Payer: Self-pay | Admitting: *Deleted

## 2016-12-22 ENCOUNTER — Emergency Department (HOSPITAL_COMMUNITY)
Admission: EM | Admit: 2016-12-22 | Discharge: 2016-12-22 | Disposition: A | Discharge status: Home / Self Care | Payer: Medicaid Other | Attending: Emergency Medicine | Admitting: Emergency Medicine

## 2016-12-22 ENCOUNTER — Emergency Department (HOSPITAL_COMMUNITY): Payer: Medicaid Other

## 2016-12-22 DIAGNOSIS — R791 Abnormal coagulation profile: Secondary | ICD-10-CM

## 2016-12-22 DIAGNOSIS — Z7982 Long term (current) use of aspirin: Secondary | ICD-10-CM | POA: Insufficient documentation

## 2016-12-22 DIAGNOSIS — Z79899 Other long term (current) drug therapy: Secondary | ICD-10-CM | POA: Insufficient documentation

## 2016-12-22 DIAGNOSIS — I10 Essential (primary) hypertension: Secondary | ICD-10-CM | POA: Diagnosis not present

## 2016-12-22 DIAGNOSIS — Z7901 Long term (current) use of anticoagulants: Secondary | ICD-10-CM | POA: Diagnosis not present

## 2016-12-22 DIAGNOSIS — R079 Chest pain, unspecified: Secondary | ICD-10-CM | POA: Insufficient documentation

## 2016-12-22 LAB — CBC
HEMATOCRIT: 39.3 % (ref 39.0–52.0)
Hemoglobin: 13 g/dL (ref 13.0–17.0)
MCH: 28 pg (ref 26.0–34.0)
MCHC: 33.1 g/dL (ref 30.0–36.0)
MCV: 84.7 fL (ref 78.0–100.0)
PLATELETS: 308 10*3/uL (ref 150–400)
RBC: 4.64 MIL/uL (ref 4.22–5.81)
RDW: 13.8 % (ref 11.5–15.5)
WBC: 6.3 10*3/uL (ref 4.0–10.5)

## 2016-12-22 LAB — BASIC METABOLIC PANEL
Anion gap: 7 (ref 5–15)
BUN: 10 mg/dL (ref 6–20)
CHLORIDE: 103 mmol/L (ref 101–111)
CO2: 27 mmol/L (ref 22–32)
CREATININE: 0.94 mg/dL (ref 0.61–1.24)
Calcium: 9.2 mg/dL (ref 8.9–10.3)
GFR calc Af Amer: >60 mL/min (ref >60)
GFR calc non Af Amer: >60 mL/min (ref >60)
GLUCOSE: 97 mg/dL (ref 65–99)
Potassium: 4.1 mmol/L (ref 3.5–5.1)
SODIUM: 137 mmol/L (ref 135–145)

## 2016-12-22 LAB — I-STAT TROPONIN, ED: Troponin i, poc: 0.02 ng/mL (ref 0.00–0.08)

## 2016-12-22 LAB — PROTIME-INR
INR: 1.92
Prothrombin Time: 22.3 seconds — ABNORMAL HIGH (ref 11.4–15.2)

## 2016-12-22 LAB — D-DIMER, QUANTITATIVE: D-Dimer, Quant: <0.27 ug/mL-FEU (ref 0.00–0.50)

## 2016-12-22 MED ORDER — HYDROCODONE-ACETAMINOPHEN 5-325 MG PO TABS
1.0000 | ORAL_TABLET | Freq: Once | ORAL | Status: AC
  Administered 2016-12-22: 1 via ORAL
  Filled 2016-12-22: qty 1

## 2016-12-22 NOTE — ED Triage Notes (Signed)
The pt is c/o chest pain and sob for 2 weeks  He had a heart valve replaced march 2018   He takes couimadin

## 2016-12-22 NOTE — ED Notes (Signed)
Pt departed in NAD, refused use of wheelchair.  

## 2016-12-22 NOTE — ED Provider Notes (Signed)
[facility]PT Provider Note   CSN: [id number] Arrival date & time: 12/22/16  1703     History   Chief Complaint Chief Complaint  Patient presents with  . Chest Pain    HPI Isiaih Hollenbach is a 31 y.o. male.  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 He reports 2 days of constant, 8/10 in intensity, central chest pain that radiates to both sides of chest, no radiation to back. He reports associated SOB, no N/V or diaphoresis. Pain is worse with stress; he has been living with family members while in [place] and family has been stressing him out recently. He has tried advil with no relief. No fevers, chills, cough/cold symptoms, or recent illness. He has been compliant with medications. No drug or alcohol use.  Of note, patient reports taking multivitamin that contains Vitamin K. His recent INR was subtherapeutic.      Past Medical History:  Diagnosis Date  . Chest pain 09/19/2016  . Hypertension     Patient Active Problem List   Diagnosis Date Noted  . H/O mitral valve replacement 09/19/2016  . Dyspnea 09/19/2016  . 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 Elevated troponin   . Chest pain 09/18/2016    Past Surgical History:  Procedure Laterality Date  . CARDIAC SURGERY  07/29/2016   mitral & tricuspid valve replacement  . LEFT HEART CATH AND CORONARY ANGIOGRAPHY N/A 09/20/2016   Procedure: Left Heart Cath and Coronary Angiography;  Surgeon: Lyn Records, MD;  Location: [facility] INVASIVE CV LAB;  Service: Cardiovascular;  Laterality: N/A;       Home Medications    Prior to Admission medications   Medication Sig Start Date End Date Taking? Authorizing Provider  aspirin EC 81 MG tablet Take 81 mg by mouth daily.   Yes [provider]  docusate sodium (COLACE) 100 MG capsule Take 100 mg by mouth daily as needed for mild constipation.    Yes  [provider]  ketorolac (TORADOL) 10 MG tablet Take 10 mg by mouth every 6 (six) hours as needed for severe pain.   Yes [provider]  warfarin (COUMADIN) 5 MG tablet Take 1 to 1.5 tablets by mouth daily as directed Patient taking differently: Take 5 mg by mouth daily. Take 1 to 1.5 tablets by mouth daily as directed 12/16/16  Yes Hilty, Lisette Abu, MD  colchicine 0.6 MG tablet Take 1 tablet (0.6 mg total) by mouth 2 (two) times daily. Patient not taking: Reported on 12/22/2016 09/22/16   Jodelle Gross, NP    Family History Family History  Problem Relation Age of Onset  . Heart murmur Mother     Social History Social History  Substance Use Topics  . Smoking status: Never Smoker  . Smokeless tobacco: Never Used  . Alcohol use No     Allergies   Patient has no known allergies.   Review of Systems Review of Systems All other systems reviewed and are negative except that which was mentioned in HPI   Physical Exam Updated Vital Signs BP 104/73   Pulse 76   Temp 98.2 F (36.8 C) (Oral)   Resp 17   SpO2 96%   Physical Exam  Constitutional: He is oriented to person, place, and time. He appears well-developed and well-nourished. No distress.  HENT:  Head: Normocephalic and atraumatic.  Moist mucous membranes  Eyes: Pupils are equal, round,  and reactive to light. Conjunctivae are normal.  Neck: Neck supple.  Cardiovascular: Normal rate and regular rhythm.   Murmur heard. Pulmonary/Chest: Effort normal and breath sounds normal.  Abdominal: Soft. Bowel sounds are normal. He exhibits no distension. There is no tenderness.  Musculoskeletal: He exhibits no edema.  Neurological: He is alert and oriented to person, place, and time.  Fluent speech  Skin: Skin is warm and dry.  Psychiatric: He has a normal mood and affect. Judgment normal.  Nursing note and vitals reviewed.    ED Treatments / Results  Labs (all labs ordered are listed, but only  abnormal results are displayed) Labs Reviewed  TROPONIN I - Abnormal; Notable for the following:       Result Value   Troponin I 0.03 (*)    All other components within normal limits  PROTIME-INR - Abnormal; Notable for the following:    Prothrombin Time 22.3 (*)    All other components within normal limits  BASIC METABOLIC PANEL  CBC  D-DIMER, QUANTITATIVE (NOT AT [facility])  I-STAT TROPONIN, ED    EKG  EKG Interpretation  Date/Time:  Sunday December 22 2016 17:07:08 EDT Ventricular Rate:  91 PR Interval:  190 QRS Duration: 114 QT Interval:  354 QTC Calculation: 435 R Axis:   -52 Text Interpretation:  Normal sinus rhythm Left anterior fascicular block Left ventricular hypertrophy Nonspecific T wave abnormality Abnormal ECG No significant change since last tracing Confirmed by Frederick Peers [id number]) on 12/22/2016 7:06:42 PM       Radiology Dg Chest 2 View  Result Date: 12/22/2016 CLINICAL DATA:  Mid chest pain. EXAM: CHEST  2 VIEW COMPARISON:  09/18/2016. FINDINGS: Stable median sternotomy wires and prosthetic heart valves. Clear lungs with normal vascularity. Unremarkable bones. IMPRESSION: No acute abnormality. Electronically Signed   By: Beckie Salts M.D.   On: 12/22/2016 18:45    Procedures Procedures (including critical care time)  Medications Ordered in ED Medications  HYDROcodone-acetaminophen (NORCO/VICODIN) 5-325 MG per tablet 1 tablet (not administered)  HYDROcodone-acetaminophen (NORCO/VICODIN) 5-325 MG per tablet 1 tablet (1 tablet Oral Given 12/22/16 2109)     Initial Impression / Assessment and Plan / ED Course  I have reviewed the triage vital signs and the nursing notes.  Pertinent labs & imaging results that were available during my care of the patient were reviewed by me and considered in my medical decision making (see chart for details).    PT w/ h/o Mitral and tricuspid valve replacement due to congenital heart disease who presents with 2 days of  constant central CP, mild SOB. He was comfortable on exam with normal vital signs. EKG reassuring compared to previous. No evidence of volume overload. Obtained above lab work which shows negative serial troponins, normal d-dimer, INR mildly subtherapeutic at 1.9. He has been on a multivitamin containing vitamin K and I instructed him to discontinue. I also discussed dietary changes based on Coumadin use.  Chest x-ray negative acute. No sudden ripping or tearing chest pain and no radiation of the back to suggest aortic dissection. Because of his cardiac history and recent catheterization in May, I discussed his case with cardiologist, Dr. Allena Katz. We agreed that based on negative troponins, unchanged EKG with 2 days of constant pain, his pain is very unlikely to represent ACS. His HEART score is low based on young age and overall health despite valvular problems. Furthermore, the fact that his pain seems to be worse when he is under more stress suggest noncardiac cause.  I've instructed him to contact cardiology clinic in the morning to schedule a follow-up appointment. Extensively reviewed return precautions with him. He voiced understanding and was discharged in satisfactory condition.  Final Clinical Impressions(s) / ED Diagnoses   Final diagnoses:  Nonspecific chest pain  Subtherapeutic international normalized ratio (INR)    New Prescriptions New Prescriptions   No medications on file     Alexiss Iturralde, Ambrose Finlandachel Morgan, MD 12/22/16 2311

## 2016-12-22 NOTE — ED Notes (Signed)
Dr. Erma HeritageIsaacs notified of pt positive troponin. Dr. Corlis LeakMackuen notified, pt will go to next room.

## 2016-12-22 NOTE — Discharge Instructions (Signed)
STOP TAKING MULTIVITAMIN AS IT MAY BE AFFECTING YOUR INR/COUMADIN LEVEL. FOLLOW UP WITH CARDIOLOGY CLINIC. RETURN TO ER IF WORSENING PAIN, BREATHING PROBLEMS, OR FEVER. TAKE ALL MEDICINES AS PRESCRIBED.

## 2016-12-23 LAB — TROPONIN I: Troponin I: 0.03 ng/mL (ref <0.03)

## 2016-12-30 ENCOUNTER — Emergency Department (HOSPITAL_COMMUNITY)
Admission: EM | Admit: 2016-12-30 | Discharge: 2016-12-30 | Disposition: A | Discharge status: Home / Self Care | Payer: Medicaid Other | Attending: Emergency Medicine | Admitting: Emergency Medicine

## 2016-12-30 DIAGNOSIS — Z5321 Procedure and treatment not carried out due to patient leaving prior to being seen by health care provider: Secondary | ICD-10-CM | POA: Insufficient documentation

## 2016-12-30 DIAGNOSIS — J029 Acute pharyngitis, unspecified: Secondary | ICD-10-CM | POA: Diagnosis present

## 2016-12-30 LAB — RAPID STREP SCREEN (MED CTR MEBANE ONLY): Streptococcus, Group A Screen (Direct): NEGATIVE

## 2016-12-30 NOTE — ED Triage Notes (Signed)
Pt states that he has had a sore throat since yesterday. Alert and oriented.

## 2017-01-01 ENCOUNTER — Encounter: Payer: Self-pay | Admitting: Internal Medicine

## 2017-01-01 ENCOUNTER — Ambulatory Visit (INDEPENDENT_AMBULATORY_CARE_PROVIDER_SITE_OTHER): Payer: BLUE CROSS/BLUE SHIELD | Admitting: Internal Medicine

## 2017-01-01 ENCOUNTER — Telehealth: Payer: Self-pay | Admitting: Internal Medicine

## 2017-01-01 ENCOUNTER — Ambulatory Visit (INDEPENDENT_AMBULATORY_CARE_PROVIDER_SITE_OTHER): Payer: BLUE CROSS/BLUE SHIELD | Admitting: Pharmacist

## 2017-01-01 VITALS — BP 127/85 | HR 105 | Ht 67.0 in | Wt 133.0 lb

## 2017-01-01 DIAGNOSIS — Z9889 Other specified postprocedural states: Secondary | ICD-10-CM

## 2017-01-01 DIAGNOSIS — Z952 Presence of prosthetic heart valve: Secondary | ICD-10-CM

## 2017-01-01 DIAGNOSIS — R079 Chest pain, unspecified: Secondary | ICD-10-CM

## 2017-01-01 LAB — CULTURE, GROUP A STREP (THRC)

## 2017-01-01 LAB — POCT INR: INR: 3

## 2017-01-01 NOTE — Progress Notes (Signed)
OFFICE FOLLOW-UP NOTE  Chief Complaint:  No complaints, follow-up ER visit  Primary Care Physician: Patient, No Pcp Per  HPI:  Robert Mcmahon is a 31 y.o. 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 Jude Masters series mechanical mitral valve and concomitant tricuspid valve annuloplasty with a 28 mm Medtronic contour 3-D ring in [place]. He was transitioning to living in [place]. He presented to the hospital in May with chest pain and shortness of breath. He was found to have an elevated troponin which rose and fell consistent more with an ACS pattern. His INR was subtherapeutic on admission and I was concerned about possibly a coronary thrombus. Although he reported having had heart catheterization prior to valve surgery, we could only find records of his right heart catheterization. My experience is not unusual for young patient did not have left heart catheterization prior to valve surgery given the low likelihood of coronary disease. Based on these findings I thought it prudent for him to undergo left heart catheterization. He did have left heart catheterization with Dr. Katrinka Blazing on 09/20/2016. This was noted to be difficult procedure from the radial approach. He was not found to have any obstructive coronary disease. He was transitioned back to warfarin and presents today for follow-up. Since discharge he said no further chest pain or worsening shortness of breath. INR most recently was therapeutic. INR today is at goal at 2.4. He says that he may stay in [place] for the summer but could transition back to [place]. He also recently saw his surgeon who is requesting records of this hospitalization.  01/01/2017  Robert Mcmahon was recently seen in the ER with complaints of chest pain. He ruled out for MI and was not felt to be cardiac. He says he's been under less stress and is trying to  get SSDI. His INR was slightly subtherapeutic. He is due for recheck and will probably check that today. It started some vitamins, possibly containing vitamin K which may be interfered with his INRs. He had planted going back to [place] but is currently staying in [place] with his family. We discussed work and he mentioned that he used to work for [employer], but had passed out which led to finding of his valvular heart disease. Recently however he's done very well after valve replacement and really has no cardiac limitations that I'm aware of that would keep him from working.  PMHx:  Past Medical History:  Diagnosis Date  . Chest pain 09/19/2016  . Hypertension     Past Surgical History:  Procedure Laterality Date  . CARDIAC SURGERY  07/29/2016   mitral & tricuspid valve replacement  . LEFT HEART CATH AND CORONARY ANGIOGRAPHY N/A 09/20/2016   Procedure: Left Heart Cath and Coronary Angiography;  Surgeon: Lyn Records, MD;  Location: [facility] INVASIVE CV LAB;  Service: Cardiovascular;  Laterality: N/A;    FAMHx:  Family History  Problem Relation Age of Onset  . Heart murmur Mother     SOCHx:   reports that he has never smoked. He has never used smokeless tobacco. He reports that he does not drink alcohol or use drugs.  ALLERGIES:  No Known Allergies  ROS: Pertinent items noted in HPI and remainder of comprehensive ROS otherwise negative.  HOME MEDS: Current Outpatient Prescriptions on File Prior to Visit  Medication Sig Dispense Refill  . aspirin EC  81 MG tablet Take 81 mg by mouth daily.    . colchicine 0.6 MG tablet Take 1 tablet (0.6 mg total) by mouth 2 (two) times daily. 60 tablet 3  . docusate sodium (COLACE) 100 MG capsule Take 100 mg by mouth daily as needed for mild constipation.     Marland Kitchen ketorolac (TORADOL) 10 MG tablet Take 10 mg by mouth every 6 (six) hours as needed for severe pain.    Marland Kitchen warfarin (COUMADIN) 5 MG tablet Take 1 to 1.5 tablets by mouth daily as  directed (Patient taking differently: Take 5 mg by mouth daily. Take 1 to 1.5 tablets by mouth daily as directed) 105 tablet 0   No current facility-administered medications on file prior to visit.     LABS/IMAGING: No results found for this or any previous visit (from the past 48 hour(s)). No results found.  LIPID PANEL: No results found for: CHOL, TRIG, HDL, CHOLHDL, VLDL, LDLCALC, LDLDIRECT   WEIGHTS: Wt Readings from Last 3 Encounters:  01/01/17 133 lb (60.3 kg)  10/09/16 135 lb (61.2 kg)  09/22/16 129 lb 1.6 oz (58.6 kg)    VITALS: BP 127/85   Pulse (!) 105   Ht 5\' 7"  (1.702 m)   Wt 133 lb (60.3 kg)   BMI 20.83 kg/m   EXAM: General appearance: alert, no distress and Thin Neck: no carotid bruit and no JVD Lungs: clear to auscultation bilaterally Heart: regular rate and rhythm, S1, S2 normal and ejection click present Abdomen: soft, non-tender; bowel sounds normal; no masses,  no organomegaly Extremities: extremities normal, atraumatic, no cyanosis or edema Pulses: 2+ and symmetric Skin: Skin color, texture, turgor normal. No rashes or lesions Neurologic: Grossly normal Psych: Pleasant  EKG: Deferred  ASSESSMENT: 1. Chest pain - likely related to stress 2. Elevated troponin-possibly related to transient coronary thrombus 3. 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 Jude Master series mitral valve replacement 4. History of tricuspid regurgitation status post 28 mm Medtronic 3-D contour series tricuspid annuloplasty ring 5. Long-term anticoagulation on warfarin  PLAN: 1.   Robert Mcmahon was again seen recently in the ER for chest pain. He ruled out for MI given his recent cath results I think it's unlikely this is coronary. He's been under a lot of stress stranded determine if he'll get any disability coverage. He is currently unemployed and does not have health insurance. He is due for repeat INR check in about a week therefore will recheck it today for  convenience. Follow-up with me in 6 months or sooner as necessary.  Chrystie Nose, MD, [facility]  [place]  [facility]  Attending Cardiologist  Direct Dial: [phone]  Fax: [phone]  Website:  [email or web address] Nicely Solace Wendorff 01/01/2017, 11:51 AM

## 2017-01-01 NOTE — Telephone Encounter (Signed)
Patient called with MD advice. He voiced understanding but expressed concern that this issue may affect him whenever he decides (if he decides) to try and go back to work.

## 2017-01-01 NOTE — Telephone Encounter (Signed)
-----   Message from Chrystie Nose, MD sent at 01/01/2017 12:47 PM EDT ----- Regarding: RE: question Possibly - may be arthritis from the open chest surgery.  Dr Rexene Edison  ----- Message ----- From: Lindell Spar, RN Sent: 01/01/2017  12:10 PM To: Chrystie Nose, MD Subject: question                                       Patient states his thoracic area hurts him every time it is cold, whether it is indoor and r/t central air or outdoor temp since he had surgery. He wanted to know if this is something he will have to deal with all the time?

## 2017-01-01 NOTE — Patient Instructions (Signed)
Your physician wants you to follow-up in: 6 months with Dr. Hilty. You will receive a reminder letter in the mail two months in advance. If you don't receive a letter, please call our office to schedule the follow-up appointment.    

## 2017-02-05 ENCOUNTER — Other Ambulatory Visit: Payer: Self-pay

## 2017-02-05 ENCOUNTER — Emergency Department (HOSPITAL_COMMUNITY): Payer: BLUE CROSS/BLUE SHIELD

## 2017-02-05 ENCOUNTER — Telehealth: Payer: Self-pay | Admitting: Internal Medicine

## 2017-02-05 ENCOUNTER — Encounter (HOSPITAL_COMMUNITY): Payer: Self-pay | Admitting: Emergency Medicine

## 2017-02-05 ENCOUNTER — Emergency Department (HOSPITAL_COMMUNITY)
Admission: EM | Admit: 2017-02-05 | Discharge: 2017-02-05 | Disposition: A | Discharge status: Home / Self Care | Payer: BLUE CROSS/BLUE SHIELD | Attending: Emergency Medicine | Admitting: Emergency Medicine

## 2017-02-05 DIAGNOSIS — R202 Paresthesia of skin: Secondary | ICD-10-CM | POA: Insufficient documentation

## 2017-02-05 DIAGNOSIS — Z7982 Long term (current) use of aspirin: Secondary | ICD-10-CM | POA: Insufficient documentation

## 2017-02-05 DIAGNOSIS — R0789 Other chest pain: Secondary | ICD-10-CM | POA: Insufficient documentation

## 2017-02-05 DIAGNOSIS — Z7901 Long term (current) use of anticoagulants: Secondary | ICD-10-CM | POA: Insufficient documentation

## 2017-02-05 DIAGNOSIS — R0602 Shortness of breath: Secondary | ICD-10-CM | POA: Insufficient documentation

## 2017-02-05 DIAGNOSIS — I10 Essential (primary) hypertension: Secondary | ICD-10-CM | POA: Insufficient documentation

## 2017-02-05 DIAGNOSIS — Z952 Presence of prosthetic heart valve: Secondary | ICD-10-CM | POA: Insufficient documentation

## 2017-02-05 DIAGNOSIS — Z79899 Other long term (current) drug therapy: Secondary | ICD-10-CM | POA: Insufficient documentation

## 2017-02-05 DIAGNOSIS — R61 Generalized hyperhidrosis: Secondary | ICD-10-CM | POA: Insufficient documentation

## 2017-02-05 LAB — CBC
HEMATOCRIT: 39.3 % (ref 39.0–52.0)
HEMOGLOBIN: 12.6 g/dL — AB (ref 13.0–17.0)
MCH: 28.3 pg (ref 26.0–34.0)
MCHC: 32.1 g/dL (ref 30.0–36.0)
MCV: 88.3 fL (ref 78.0–100.0)
Platelets: 251 10*3/uL (ref 150–400)
RBC: 4.45 MIL/uL (ref 4.22–5.81)
RDW: 13.8 % (ref 11.5–15.5)
WBC: 5.5 10*3/uL (ref 4.0–10.5)

## 2017-02-05 LAB — BASIC METABOLIC PANEL
ANION GAP: 10 (ref 5–15)
BUN: 10 mg/dL (ref 6–20)
CHLORIDE: 102 mmol/L (ref 101–111)
CO2: 26 mmol/L (ref 22–32)
Calcium: 9.5 mg/dL (ref 8.9–10.3)
Creatinine, Ser: 1.06 mg/dL (ref 0.61–1.24)
GFR calc Af Amer: >60 mL/min (ref >60)
Glucose, Bld: 84 mg/dL (ref 65–99)
POTASSIUM: 4 mmol/L (ref 3.5–5.1)
SODIUM: 138 mmol/L (ref 135–145)

## 2017-02-05 LAB — I-STAT TROPONIN, ED
TROPONIN I, POC: 0.02 ng/mL (ref 0.00–0.08)
Troponin i, poc: 0.02 ng/mL (ref 0.00–0.08)

## 2017-02-05 LAB — PROTIME-INR
INR: 2.06
Prothrombin Time: 23 seconds — ABNORMAL HIGH (ref 11.4–15.2)

## 2017-02-05 MED ORDER — ASPIRIN 81 MG PO CHEW
324.0000 mg | CHEWABLE_TABLET | Freq: Once | ORAL | Status: AC
  Administered 2017-02-05: 324 mg via ORAL
  Filled 2017-02-05: qty 4

## 2017-02-05 MED ORDER — NITROGLYCERIN 0.4 MG SL SUBL
0.4000 mg | SUBLINGUAL_TABLET | SUBLINGUAL | Status: DC | PRN
  Administered 2017-02-05 (×2): 0.4 mg via SUBLINGUAL
  Filled 2017-02-05: qty 1

## 2017-02-05 NOTE — ED Notes (Signed)
Abigale PA at bedside   

## 2017-02-05 NOTE — ED Triage Notes (Signed)
Pt reports generalized chest pain that radiates to right arm x1.5 week, hx of cardiac stents/open heart surgery. A/ox4, resp e/u, nad.

## 2017-02-05 NOTE — Telephone Encounter (Signed)
New Message     Pt c/o of Chest Pain: STAT if CP now or developed within 24 hours  1. Are you having CP right now?  yes  2. Are you experiencing any other symptoms (ex. SOB, nausea, vomiting, sweating)? Sob   3. How long have you been experiencing CP? A couple weeks   4. Is your CP continuous or coming and going? Comes and goes   5. Have you taken Nitroglycerin?  No just bp meds, blood thinners  ?

## 2017-02-05 NOTE — ED Provider Notes (Signed)
[facility] Provider Note   CSN: [id number] Arrival date & time: 02/05/17  1008     History   Chief Complaint Chief Complaint  Patient presents with  . Chest Pain    HPI Robert Mcmahon is a 31 y.o. male onset intermittent cp 1.5 weeks, has been constant for 1 week. He awoke this morning with unrelenting cp. He has associated ,diaphoresis, SOB and R arm paresthesia. Patient did not take asa or ntg. He did take BP meds and morphine at home. He has a history of congenital heart disease, mitral and tricuspid valve replacement. He is on chronic anticoagulation with Coumadin. Patient had a cardiac catheterization performed on 11 May, 2018. This study showed no evidence of obstructive coronary artery disease. He states that he is almost out of his morphine.  HPI  Past Medical History:  Diagnosis Date  . Chest pain 09/19/2016  . Hypertension     Patient Active Problem List   Diagnosis Date Noted  . H/O mitral valve replacement 09/19/2016  . Dyspnea 09/19/2016  . 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 Elevated troponin   . Chest pain 09/18/2016    Past Surgical History:  Procedure Laterality Date  . CARDIAC SURGERY  07/29/2016   mitral & tricuspid valve replacement  . LEFT HEART CATH AND CORONARY ANGIOGRAPHY N/A 09/20/2016   Procedure: Left Heart Cath and Coronary Angiography;  Surgeon: Lyn Records, MD;  Location: [facility] INVASIVE CV LAB;  Service: Cardiovascular;  Laterality: N/A;       Home Medications    Prior to Admission medications   Medication Sig Start Date End Date Taking? Authorizing Provider  aspirin EC 81 MG tablet Take 81 mg by mouth daily.    [provider]  colchicine 0.6 MG tablet Take 1 tablet (0.6 mg total) by mouth 2 (two) times daily. 09/22/16   Jodelle Gross, NP  docusate sodium (COLACE) 100 MG capsule Take 100 mg by mouth daily as needed for mild constipation.     [provider]  ketorolac (TORADOL) 10 MG tablet Take 10  mg by mouth every 6 (six) hours as needed for severe pain.    [provider]  metoprolol tartrate (LOPRESSOR) 25 MG tablet Take 1 tablet (25 mg total) by mouth 2 (two) times daily. 02/05/17   Hilty, Lisette Abu, MD  warfarin (COUMADIN) 5 MG tablet Take 1 to 1.5 tablets by mouth daily as directed Patient taking differently: Take 5 mg by mouth daily. Take 1 to 1.5 tablets by mouth daily as directed 12/16/16   Chrystie Nose, MD    Family History Family History  Problem Relation Age of Onset  . Heart murmur Mother     Social History Social History  Substance Use Topics  . Smoking status: Never Smoker  . Smokeless tobacco: Never Used  . Alcohol use No     Allergies   Patient has no known allergies.   Review of Systems Review of Systems  Ten systems reviewed and are negative for acute change, except as noted in the HPI.   Physical Exam Updated Vital Signs BP 128/68   Pulse 88   Temp 98.1 F (36.7 C) (Oral)   Resp 16   SpO2 100%   Physical Exam  Constitutional: He appears well-developed and well-nourished. No distress.  HENT:  Head: Normocephalic and atraumatic.  Eyes: Conjunctivae are normal. No scleral icterus.  Neck: Normal range of motion. Neck supple.  Cardiovascular: Normal rate, regular rhythm and normal  heart sounds.   Pulmonary/Chest: Effort normal and breath sounds normal. No respiratory distress. He exhibits tenderness (TTP BL chest wall. well healing midline surgical scar).  Abdominal: Soft. There is no tenderness.  Musculoskeletal: He exhibits no edema.  Neurological: He is alert.  Skin: Skin is warm and dry. He is not diaphoretic.  Psychiatric: His behavior is normal.  Nursing note and vitals reviewed.    ED Treatments / Results  Labs (all labs ordered are listed, but only abnormal results are displayed) Labs Reviewed  CBC - Abnormal; Notable for the following:       Result Value   Hemoglobin 12.6 (*)    All other components within normal  limits  BASIC METABOLIC PANEL  I-STAT TROPONIN, ED    EKG  EKG Interpretation None       Radiology Dg Chest 2 View  Result Date: 02/05/2017 CLINICAL DATA:  Chest pain. EXAM: CHEST  2 VIEW COMPARISON:  Radiographs of December 22, 2016. FINDINGS: The heart size and mediastinal contours are within normal limits. Status post cardiac valve repair. No pneumothorax or pleural effusion is noted. Both lungs are clear. The visualized skeletal structures are unremarkable. IMPRESSION: No active cardiopulmonary disease. Electronically Signed   By: Lupita Raider, M.D.   On: 02/05/2017 10:53    Procedures Procedures (including critical care time)  Medications Ordered in ED Medications - No data to display   Initial Impression / Assessment and Plan / ED Course  I have reviewed the triage vital signs and the nursing notes.  Pertinent labs & imaging results that were available during my care of the patient were reviewed by me and considered in my medical decision making (see chart for details).      Patient with low risk CP given his recent cath. No active cp at this time. I suspect Chest wall pain.  Paitent will be discharged to follow up with dr. Rennis Golden. Discussed return precautions.HEART score less than 3. PERC neg  Final Clinical Impressions(s) / ED Diagnoses   Final diagnoses:  Atypical chest pain    New Prescriptions New Prescriptions   No medications on file     Arthor Captain, PA-C 02/08/17 0944    Mancel Bale, MD 02/10/17 1036

## 2017-02-05 NOTE — Discharge Instructions (Signed)
Get help right away if: You have nausea or vomiting. You feel sweaty or light-headed. You have a cough with phlegm (sputum) or you cough up blood. You develop shortness of breath.

## 2017-02-05 NOTE — Telephone Encounter (Signed)
S/w pt he states that he has been experiencing chest pain and pressure for the last couple weeks he states that last week it was intermittent but starting yesterday it has been constant. He describes chest pain this is located mid-chest sternal area "where the surgery scar is", and pressure, pt describes this as a burning pain and states that it is not a stabbing pain, also c/o fatigue,  intermittent sweating, intermittent SOB(espically while lying down) and intertmittant SOB at rest and exertional. He rates the chest pain 8-10. Pt had mitral valve repair in [place] and Left heart at [place]  09-20-16 by Dr Katrinka Blazing. Pt is taking all medications as prescribed warfarin  daily, metoprolol  daily he has been taking tylenol(600mg ) and aleve for this pain be he states that has stopped working. I informed pt to go directly to the ER. He states that he does not drive and has to wait for someone that is able to take him. Informed pt that he has to go ASAP and should call EMS. He states that he will go ASAP and will call someone else to take him, he states that he will go within the hour.pt does have appt scheduled with Dr Rennis Golden [phone]  he will keep this appt for ED follow up to discuss this.

## 2017-02-06 NOTE — Telephone Encounter (Signed)
Patient went to ED on 02/06/17

## 2017-02-07 ENCOUNTER — Ambulatory Visit (INDEPENDENT_AMBULATORY_CARE_PROVIDER_SITE_OTHER): Payer: BLUE CROSS/BLUE SHIELD | Admitting: Internal Medicine

## 2017-02-07 ENCOUNTER — Ambulatory Visit (INDEPENDENT_AMBULATORY_CARE_PROVIDER_SITE_OTHER): Payer: BLUE CROSS/BLUE SHIELD | Admitting: Pharmacist

## 2017-02-07 ENCOUNTER — Encounter: Payer: Self-pay | Admitting: Internal Medicine

## 2017-02-07 VITALS — BP 117/80 | HR 72 | Ht 67.0 in | Wt 137.8 lb

## 2017-02-07 DIAGNOSIS — Z952 Presence of prosthetic heart valve: Secondary | ICD-10-CM

## 2017-02-07 DIAGNOSIS — Z9889 Other specified postprocedural states: Secondary | ICD-10-CM | POA: Diagnosis not present

## 2017-02-07 DIAGNOSIS — R0789 Other chest pain: Secondary | ICD-10-CM

## 2017-02-07 LAB — POCT INR: INR: 2.8

## 2017-02-07 MED ORDER — DICLOFENAC SODIUM 1 % TD GEL: TRANSDERMAL | 0 refills | Status: DC

## 2017-02-07 NOTE — Patient Instructions (Addendum)
Your physician has recommended you make the following change in your medication:  -- START diclofenac topical twice daily for 1-2 weeks -- if you have a rash or skin irritation, please call our office  Follow up with Dr. Rennis Golden in Feb 2019

## 2017-02-07 NOTE — Progress Notes (Signed)
OFFICE FOLLOW-UP NOTE  Chief Complaint:  Follow-up ER visit  Primary Care Physician: Patient, No Pcp Per  HPI:  Robert Mcmahon is a 31 y.o. 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 Jude Masters series mechanical mitral valve and concomitant tricuspid valve annuloplasty with a 28 mm Medtronic contour 3-D ring in [place]. He was transitioning to living in [place]. He presented to the hospital in May with chest pain and shortness of breath. He was found to have an elevated troponin which rose and fell consistent more with an ACS pattern. His INR was subtherapeutic on admission and I was concerned about possibly a coronary thrombus. Although he reported having had heart catheterization prior to valve surgery, we could only find records of his right heart catheterization. My experience is not unusual for young patient did not have left heart catheterization prior to valve surgery given the low likelihood of coronary disease. Based on these findings I thought it prudent for him to undergo left heart catheterization. He did have left heart catheterization with Dr. Katrinka Blazing on 09/20/2016. This was noted to be difficult procedure from the radial approach. He was not found to have any obstructive coronary disease. He was transitioned back to warfarin and presents today for follow-up. Since discharge he said no further chest pain or worsening shortness of breath. INR most recently was therapeutic. INR today is at goal at 2.4. He says that he may stay in [place] for the summer but could transition back to [place]. He also recently saw his surgeon who is requesting records of this hospitalization.  01/01/2017  Robert Mcmahon was recently seen in the ER with complaints of chest pain. He ruled out for MI and was not felt to be cardiac. He says he's been under less stress and is trying to get SSDI. His  INR was slightly subtherapeutic. He is due for recheck and will probably check that today. It started some vitamins, possibly containing vitamin K which may be interfered with his INRs. He had planted going back to [place] but is currently staying in [place] with his family. We discussed work and he mentioned that he used to work for [employer], but had passed out which led to finding of his valvular heart disease. Recently however he's done very well after valve replacement and really has no cardiac limitations that I'm aware of that would keep him from working.  01/30/2017  Robert Mcmahon was recently seen again in the emergency department. He had some recurrent chest pain and was evaluated and found to have a normal troponin. He says his pain is been persistent now for about 2 weeks. It's located across the anterior chest and worse when taking a deep breath. His INR was therapeutic at 2.06.  PMHx:  Past Medical History:  Diagnosis Date  . Chest pain 09/19/2016  . Hypertension     Past Surgical History:  Procedure Laterality Date  . CARDIAC SURGERY  07/29/2016   mitral & tricuspid valve replacement  . LEFT HEART CATH AND CORONARY ANGIOGRAPHY N/A 09/20/2016   Procedure: Left Heart Cath and Coronary Angiography;  Surgeon: Lyn Records, MD;  Location: [facility] INVASIVE CV LAB;  Service: Cardiovascular;  Laterality: N/A;    FAMHx:  Family History  Problem Relation Age of Onset  . Heart murmur Mother     SOCHx:   reports that he has never  smoked. He has never used smokeless tobacco. He reports that he does not drink alcohol or use drugs.  ALLERGIES:  No Known Allergies  ROS: Pertinent items noted in HPI and remainder of comprehensive ROS otherwise negative.  HOME MEDS: Current Outpatient Prescriptions on File Prior to Visit  Medication Sig Dispense Refill  . aspirin EC 81 MG tablet Take 81 mg by mouth daily.    . colchicine 0.6 MG tablet Take 1 tablet (0.6 mg total) by mouth 2  (two) times daily. 60 tablet 3  . docusate sodium (COLACE) 100 MG capsule Take 100 mg by mouth daily as needed for mild constipation.     Marland Kitchen ketorolac (TORADOL) 10 MG tablet Take 10 mg by mouth every 6 (six) hours as needed for severe pain.    . metoprolol tartrate (LOPRESSOR) 25 MG tablet Take 1 tablet (25 mg total) by mouth 2 (two) times daily. 180 tablet 3  . warfarin (COUMADIN) 5 MG tablet Take 1 to 1.5 tablets by mouth daily as directed (Patient taking differently: Take 5 mg by mouth daily. ) 105 tablet 0   No current facility-administered medications on file prior to visit.     LABS/IMAGING: No results found for this or any previous visit (from the past 48 hour(s)). No results found.  LIPID PANEL: No results found for: CHOL, TRIG, HDL, CHOLHDL, VLDL, LDLCALC, LDLDIRECT   WEIGHTS: Wt Readings from Last 3 Encounters:  02/07/17 137 lb 12.8 oz (62.5 kg)  01/01/17 133 lb (60.3 kg)  10/09/16 135 lb (61.2 kg)    VITALS: BP 117/80   Pulse 72   Ht  (1.702 m)   Wt 137 lb 12.8 oz (62.5 kg)   SpO2 100%   BMI 21.58 kg/m   EXAM: General appearance: alert, no distress and Thin Neck: no carotid bruit and no JVD Lungs: clear to auscultation bilaterally Heart: regular rate and rhythm, S1, S2 normal, ejection click present and There is pain in palpation over the anterior chest which is worse with deep breathing Abdomen: soft, non-tender; bowel sounds normal; no masses,  no organomegaly Extremities: extremities normal, atraumatic, no cyanosis or edema Pulses: 2+ and symmetric Skin: Skin color, texture, turgor normal. No rashes or lesions Neurologic: Grossly normal Psych: Pleasant  EKG: Deferred  ASSESSMENT: 1. Chest wall pain - ?neuropathic pain, pleurisy, costochondritis 2. Elevated troponin-possibly related to transient coronary thrombus 3. 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 Jude Master series mitral valve replacement 4. History of tricuspid regurgitation  status post 28 mm Medtronic 3-D contour series tricuspid annuloplasty ring 5. Long-term anticoagulation on warfarin  PLAN: 1.   Robert Mcmahon has had recurrent chest pain. He is ruled out with a couple of ER visits. This is more persistent pain now that somewhat worse with taking a deep breath. I recommend a trial with topical nonsteroidal as this is less systemically absorbed and unlikely to cause significant bleeding problems with warfarin. If this does not improve his symptoms after couple weeks we may consider either short course of steroids or gabapentin.  Chrystie Nose, MD, [facility]  [place]  [facility]  Attending Cardiologist  Direct Dial: [phone]  Fax: [phone]  Website:  [email or web address] 02/07/2017, 3:34 PM

## 2017-02-18 ENCOUNTER — Telehealth: Payer: Self-pay | Admitting: Internal Medicine

## 2017-02-18 DIAGNOSIS — R0789 Other chest pain: Secondary | ICD-10-CM

## 2017-02-18 NOTE — Telephone Encounter (Signed)
°*  STAT* If patient is at the pharmacy, call can be transferred to refill team.   1. Which medications need to be refilled? (please list name of each medication and dose if known) Diclorenanc Sodium( Voltaren)   2. Which pharmacy/location (including street and city if local pharmacy) is medication to be sent to?[facility] , [address] [place] [address]-UE#[phone]  3. Do they need a 30 day or 90 day supply? 30  His insurance will not pay for the mediation here in [place] and if we can send it to his pharmacy in [place] , he can get it free. Please call if you have any questions

## 2017-02-19 MED ORDER — DICLOFENAC SODIUM 1 % TD GEL: TRANSDERMAL | 1 refills | Status: DC

## 2017-02-19 NOTE — Telephone Encounter (Signed)
Ok to fill Rx with one refill.  Dr. Rexene Edison

## 2017-02-25 ENCOUNTER — Emergency Department (HOSPITAL_COMMUNITY): Payer: Medicaid Other

## 2017-02-25 ENCOUNTER — Encounter (HOSPITAL_COMMUNITY): Payer: Self-pay | Admitting: *Deleted

## 2017-02-25 ENCOUNTER — Inpatient Hospital Stay (HOSPITAL_COMMUNITY)
Admission: EM | Admit: 2017-02-25 | Discharge: 2017-02-28 | DRG: 313 | Disposition: A | Discharge status: Home / Self Care | Payer: Medicaid Other | Attending: Internal Medicine | Admitting: Internal Medicine

## 2017-02-25 ENCOUNTER — Telehealth: Payer: Self-pay | Admitting: Internal Medicine

## 2017-02-25 DIAGNOSIS — I214 Non-ST elevation (NSTEMI) myocardial infarction: Secondary | ICD-10-CM | POA: Diagnosis not present

## 2017-02-25 DIAGNOSIS — Q249 Congenital malformation of heart, unspecified: Secondary | ICD-10-CM

## 2017-02-25 DIAGNOSIS — R791 Abnormal coagulation profile: Secondary | ICD-10-CM

## 2017-02-25 DIAGNOSIS — R072 Precordial pain: Secondary | ICD-10-CM | POA: Diagnosis not present

## 2017-02-25 DIAGNOSIS — R748 Abnormal levels of other serum enzymes: Secondary | ICD-10-CM | POA: Diagnosis present

## 2017-02-25 DIAGNOSIS — Z952 Presence of prosthetic heart valve: Secondary | ICD-10-CM

## 2017-02-25 DIAGNOSIS — R079 Chest pain, unspecified: Secondary | ICD-10-CM

## 2017-02-25 DIAGNOSIS — I10 Essential (primary) hypertension: Secondary | ICD-10-CM | POA: Diagnosis present

## 2017-02-25 DIAGNOSIS — R0789 Other chest pain: Secondary | ICD-10-CM

## 2017-02-25 DIAGNOSIS — Z7901 Long term (current) use of anticoagulants: Secondary | ICD-10-CM

## 2017-02-25 DIAGNOSIS — Z9889 Other specified postprocedural states: Secondary | ICD-10-CM

## 2017-02-25 DIAGNOSIS — M109 Gout, unspecified: Secondary | ICD-10-CM | POA: Diagnosis present

## 2017-02-25 HISTORY — DX: Major depressive disorder, single episode, unspecified: F32.9

## 2017-02-25 HISTORY — DX: Depression, unspecified: F32.A

## 2017-02-25 LAB — CBC WITH DIFFERENTIAL/PLATELET
BASOS PCT: 0 %
Basophils Absolute: 0 10*3/uL (ref 0.0–0.1)
EOS ABS: 0.1 10*3/uL (ref 0.0–0.7)
Eosinophils Relative: 2 %
HEMATOCRIT: 38.7 % — AB (ref 39.0–52.0)
Hemoglobin: 13 g/dL (ref 13.0–17.0)
LYMPHS ABS: 2 10*3/uL (ref 0.7–4.0)
Lymphocytes Relative: 36 %
MCH: 29.7 pg (ref 26.0–34.0)
MCHC: 33.6 g/dL (ref 30.0–36.0)
MCV: 88.4 fL (ref 78.0–100.0)
MONO ABS: 0.3 10*3/uL (ref 0.1–1.0)
MONOS PCT: 5 %
NEUTROS ABS: 3.2 10*3/uL (ref 1.7–7.7)
Neutrophils Relative %: 57 %
Platelets: 260 10*3/uL (ref 150–400)
RBC: 4.38 MIL/uL (ref 4.22–5.81)
RDW: 13 % (ref 11.5–15.5)
WBC: 5.6 10*3/uL (ref 4.0–10.5)

## 2017-02-25 LAB — I-STAT TROPONIN, ED
TROPONIN I, POC: 0.34 ng/mL — AB (ref 0.00–0.08)
Troponin i, poc: 0.05 ng/mL (ref 0.00–0.08)

## 2017-02-25 LAB — TROPONIN I: Troponin I: 0.8 ng/mL (ref <0.03)

## 2017-02-25 LAB — BASIC METABOLIC PANEL
Anion gap: 6 (ref 5–15)
BUN: 13 mg/dL (ref 6–20)
CALCIUM: 9.2 mg/dL (ref 8.9–10.3)
CHLORIDE: 104 mmol/L (ref 101–111)
CO2: 26 mmol/L (ref 22–32)
CREATININE: 0.89 mg/dL (ref 0.61–1.24)
GFR calc Af Amer: >60 mL/min (ref >60)
GFR calc non Af Amer: >60 mL/min (ref >60)
GLUCOSE: 94 mg/dL (ref 65–99)
Potassium: 4.1 mmol/L (ref 3.5–5.1)
Sodium: 136 mmol/L (ref 135–145)

## 2017-02-25 LAB — PROTIME-INR
INR: 1.25
Prothrombin Time: 15.6 seconds — ABNORMAL HIGH (ref 11.4–15.2)

## 2017-02-25 LAB — D-DIMER, QUANTITATIVE (NOT AT ARMC)

## 2017-02-25 MED ORDER — WARFARIN - PHARMACIST DOSING INPATIENT: Freq: Every day | Status: DC

## 2017-02-25 MED ORDER — ENOXAPARIN SODIUM 60 MG/0.6ML ~~LOC~~ SOLN
60.0000 mg | Freq: Once | SUBCUTANEOUS | Status: AC
  Administered 2017-02-25: 60 mg via SUBCUTANEOUS
  Filled 2017-02-25: qty 0.6

## 2017-02-25 MED ORDER — METOPROLOL TARTRATE 25 MG PO TABS
25.0000 mg | ORAL_TABLET | Freq: Every day | ORAL | Status: DC
  Administered 2017-02-26 – 2017-02-28 (×3): 25 mg via ORAL
  Filled 2017-02-25 (×3): qty 1

## 2017-02-25 MED ORDER — HEPARIN BOLUS VIA INFUSION
3000.0000 [IU] | Freq: Once | INTRAVENOUS | Status: AC
  Administered 2017-02-25: 3000 [IU] via INTRAVENOUS
  Filled 2017-02-25: qty 3000

## 2017-02-25 MED ORDER — TRAMADOL HCL 50 MG PO TABS: 50.0000 mg | ORAL_TABLET | Freq: Four times a day (QID) | ORAL | 0 refills | Status: DC | PRN

## 2017-02-25 MED ORDER — MORPHINE SULFATE (PF) 4 MG/ML IV SOLN
4.0000 mg | Freq: Once | INTRAVENOUS | Status: AC
  Administered 2017-02-25: 4 mg via INTRAVENOUS
  Filled 2017-02-25: qty 1

## 2017-02-25 MED ORDER — NITROGLYCERIN IN D5W 200-5 MCG/ML-% IV SOLN
0.0000 ug/min | Freq: Once | INTRAVENOUS | Status: AC
  Administered 2017-02-25: 5 ug/min via INTRAVENOUS
  Filled 2017-02-25: qty 250

## 2017-02-25 MED ORDER — ONDANSETRON HCL 4 MG/2ML IJ SOLN: 4.0000 mg | Freq: Four times a day (QID) | INTRAMUSCULAR | Status: DC | PRN

## 2017-02-25 MED ORDER — SUCRALFATE 1 GM/10ML PO SUSP
1.0000 g | Freq: Three times a day (TID) | ORAL | 0 refills | Status: DC
Start: 2017-02-25 — End: 2017-02-28

## 2017-02-25 MED ORDER — PANTOPRAZOLE SODIUM 20 MG PO TBEC: 20.0000 mg | DELAYED_RELEASE_TABLET | Freq: Every day | ORAL | 0 refills | Status: DC

## 2017-02-25 MED ORDER — GI COCKTAIL ~~LOC~~
30.0000 mL | Freq: Once | ORAL | Status: AC
  Administered 2017-02-25: 30 mL via ORAL
  Filled 2017-02-25: qty 30

## 2017-02-25 MED ORDER — SODIUM CHLORIDE 0.9 % IV BOLUS (SEPSIS)
500.0000 mL | Freq: Once | INTRAVENOUS | Status: AC
  Administered 2017-02-25: 500 mL via INTRAVENOUS

## 2017-02-25 MED ORDER — ACETAMINOPHEN 325 MG PO TABS
975.0000 mg | ORAL_TABLET | Freq: Four times a day (QID) | ORAL | Status: DC | PRN
Start: 2017-02-25 — End: 2017-02-25

## 2017-02-25 MED ORDER — HEPARIN (PORCINE) IN NACL 100-0.45 UNIT/ML-% IJ SOLN
950.0000 [IU]/h | INTRAMUSCULAR | Status: DC
  Administered 2017-02-25: 950 [IU]/h via INTRAVENOUS

## 2017-02-25 MED ORDER — DICLOFENAC SODIUM 1 % TD GEL
TRANSDERMAL | 1 refills | Status: DC
Start: 2017-02-25 — End: 2017-02-25

## 2017-02-25 MED ORDER — NITROGLYCERIN 0.4 MG SL SUBL: 0.4000 mg | SUBLINGUAL_TABLET | SUBLINGUAL | Status: DC | PRN

## 2017-02-25 MED ORDER — ACETAMINOPHEN 325 MG PO TABS
650.0000 mg | ORAL_TABLET | ORAL | Status: DC | PRN
  Administered 2017-02-26 (×2): 650 mg via ORAL
  Filled 2017-02-25 (×3): qty 2

## 2017-02-25 MED ORDER — WARFARIN SODIUM 10 MG PO TABS
10.0000 mg | ORAL_TABLET | Freq: Once | ORAL | Status: AC
  Administered 2017-02-25: 10 mg via ORAL
  Filled 2017-02-25: qty 1

## 2017-02-25 NOTE — ED Triage Notes (Addendum)
Pt complaining of chest burning in the center of his chest. Nonradiating in nature. 10/10. Sudden onsent. Pt has mechanical valve placed in March of this year. EKG NSR. Pt complains that pain is worse with movement and deep breath. BP 120/76, HR 60, resp 14. No ASA or Nitro given

## 2017-02-25 NOTE — ED Provider Notes (Signed)
Emergency Department Provider Note   I have reviewed the triage vital signs and the nursing notes.   HISTORY  Chief Complaint Chest Pain   HPI Robert Mcmahon is a 31 y.o. male with PMH of HTN and mitral valve replacement on Coumadin presents to the emergency department for evaluation of sudden onset chest pain. Patient describes the pain as substernal, burning, nonradiating. He had no associated shortness of breath.She's had intermittent discomfort recently and called his cardiologist, Dr. Rennis Golden, who called him in some topical ointment for pain relief. The patient hadleft heart catheterization in May of this year which was normal. He states the pain today was atypical and that is burning in nature and worse with exertion. No fevers, chills, productive cough.  Past Medical History:  Diagnosis Date  . Chest pain 09/19/2016  . Hypertension     Patient Active Problem List   Diagnosis Date Noted  . H/O mitral valve replacement 09/19/2016  . Dyspnea 09/19/2016  . 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 Elevated troponin   . Chest wall pain 09/18/2016    Past Surgical History:  Procedure Laterality Date  . CARDIAC SURGERY  07/29/2016   mitral & tricuspid valve replacement  . LEFT HEART CATH AND CORONARY ANGIOGRAPHY N/A 09/20/2016   Procedure: Left Heart Cath and Coronary Angiography;  Surgeon: Lyn Records, MD;  Location: [facility] INVASIVE CV LAB;  Service: Cardiovascular;  Laterality: N/A;    Current Outpatient Rx  . Order #: [id number] Class: Historical Med  . Order #: [id number] Class: Normal  . Order #: [id number] Class: Historical Med  . Order #: [id number] Class: Historical Med  . Order #: [id number] Class: Normal  . Order #: [id number] Class: Normal  . Order #: [id number] Class: Historical Med    Allergies Patient has no known allergies.  Family History  Problem Relation Age of Onset  . Heart murmur Mother     Social History Social History  Substance Use Topics  . Smoking  status: Never Smoker  . Smokeless tobacco: Never Used  . Alcohol use No    Review of Systems  Constitutional: No fever/chills Eyes: No visual changes. ENT: No sore throat. Cardiovascular: Positive chest pain. Respiratory: Denies shortness of breath. Gastrointestinal: No abdominal pain.  No nausea, no vomiting.  No diarrhea.  No constipation. Genitourinary: Negative for dysuria. Musculoskeletal: Negative for back pain. Skin: Negative for rash. Neurological: Negative for headaches, focal weakness or numbness.  10-point ROS otherwise negative.  ____________________________________________   PHYSICAL EXAM:  VITAL SIGNS: ED Triage Vitals  Enc Vitals Group     BP 02/25/17 1300 128/81     Pulse Rate 02/25/17 1300 62     Resp 02/25/17 1300 (!) 22     Temp 02/25/17 1303 98 F (36.7 C)     Temp Source 02/25/17 1303 Oral     SpO2 02/25/17 1300 99 %   Constitutional: Alert and oriented. Well appearing and in no acute distress. Eyes: Conjunctivae are normal. Head: Atraumatic. Nose: No congestion/rhinnorhea. Mouth/Throat: Mucous membranes are moist.  Neck: No stridor.  Cardiovascular: Normal rate, regular rhythm. Good peripheral circulation. Grossly normal heart sounds.   Respiratory: Normal respiratory effort.  No retractions. Lungs CTAB. Gastrointestinal: Soft and nontender. No distention.  Musculoskeletal: No lower extremity tenderness nor edema. No gross deformities of extremities. Neurologic:  Normal speech and language. No gross focal neurologic deficits are appreciated.  Skin:  Skin is warm, dry and intact. No rash noted.  ____________________________________________   LABS (all labs ordered are listed, but only  abnormal results are displayed)  Labs Reviewed  CBC WITH DIFFERENTIAL/PLATELET - Abnormal; Notable for the following:       Result Value   HCT 38.7 (*)    All other components within normal limits  PROTIME-INR - Abnormal; Notable for the following:     Prothrombin Time 15.6 (*)    All other components within normal limits  BASIC METABOLIC PANEL  D-DIMER, QUANTITATIVE (NOT AT [facility])  TROPONIN I  I-STAT TROPONIN, ED   ____________________________________________  EKG   EKG Interpretation  Date/Time:  Tuesday February 25 2017 12:54:41 EDT Ventricular Rate:  64 PR Interval:    QRS Duration: 117 QT Interval:  397 QTC Calculation: 410 R Axis:   -43 Text Interpretation:  Sinus rhythm Probable left atrial enlargement Left anterior fascicular block Left ventricular hypertrophy Nonspecific T abnormalities, diffuse leads No STEMI.  Confirmed by Alona Bene [phone]) on 02/25/2017 12:58:06 PM       ____________________________________________  RADIOLOGY  Dg Chest 2 View  Result Date: 02/25/2017 CLINICAL DATA:  Burning chest.  Shortness of breath. EXAM: CHEST  2 VIEW COMPARISON:  02/05/2017. FINDINGS: Prior median sternotomy and cardiac valve replacements. Cardiomegaly with normal pulmonary vascularity. No focal infiltrate. No pleural effusion or pneumothorax. IMPRESSION: 1. Prior median sternotomy and cardiac valve replacements. Cardiomegaly. No pulmonary venous congestion. 2. No acute pulmonary disease. Electronically Signed   By: Maisie Fus  Register   On: 02/25/2017 14:46    ____________________________________________   PROCEDURES  Procedure(s) performed:   Procedures  None ____________________________________________   INITIAL IMPRESSION / ASSESSMENT AND PLAN / ED COURSE  Pertinent labs & imaging results that were available during my care of the patient were reviewed by me and considered in my medical decision making (see chart for details).  Patient resents emergency department for evaluation of sudden onset substernal chest pain which is atypical in quality. He had a normal left heart catheterization in May 2018. Followed by Dr. Rennis Golden. Plan for morphine for pain while awaiting CXR, troponin, INR, and d-dimer.    Differential includes all life-threatening causes for chest pain. This includes but is not exclusive to acute coronary syndrome, aortic dissection, pulmonary embolism, cardiac tamponade, community-acquired pneumonia, pericarditis, musculoskeletal chest wall pain, etc.  03:16 PM Initial troponin is negative. Plan for repeat troponin and reassess. Care transferred to Dr. Preston Fleeting.   ____________________________________________  FINAL CLINICAL IMPRESSION(S) / ED DIAGNOSES  Final diagnoses:  Precordial chest pain  NSTEMI (non-ST elevated myocardial infarction) (HCC)  Subtherapeutic international normalized ratio (INR)     MEDICATIONS GIVEN DURING THIS VISIT:  Medications  metoprolol tartrate (LOPRESSOR) tablet 25 mg (not administered)  nitroGLYCERIN (NITROSTAT) SL tablet 0.4 mg (not administered)  acetaminophen (TYLENOL) tablet 650 mg (650 mg Oral Given 02/26/17 0603)  ondansetron (ZOFRAN) injection 4 mg (not administered)  heparin ADULT infusion 100 units/mL (25000 units/219mL sodium chloride 0.45%) (950 Units/hr Intravenous New Bag/Given 02/25/17 2330)  Warfarin - Pharmacist Dosing Inpatient (not administered)  nitroGLYCERIN 50 mg in dextrose 5 % 250 mL (0.2 mg/mL) infusion (45 mcg/min Intravenous Rate/Dose Change 02/26/17 0130)  sodium chloride 0.9 % bolus 500 mL (0 mLs Intravenous Stopped 02/25/17 1411)  morphine 4 MG/ML injection 4 mg (4 mg Intravenous Given 02/25/17 1329)  gi cocktail (Maalox,Lidocaine,Donnatal) (30 mLs Oral Given 02/25/17 1516)  enoxaparin (LOVENOX) injection 60 mg (60 mg Subcutaneous Given 02/25/17 1616)  nitroGLYCERIN 50 mg in dextrose 5 % 250 mL (0.2 mg/mL) infusion (35 mcg/min Intravenous Transfusing/Transfer 02/25/17 2343)  morphine 4 MG/ML injection 4 mg (4 mg  Intravenous Given 02/25/17 1726)  heparin bolus via infusion 3,000 Units (3,000 Units Intravenous Bolus from Bag 02/25/17 2330)  warfarin (COUMADIN) tablet 10 mg (10 mg Oral Given 02/25/17 2353)      NEW OUTPATIENT MEDICATIONS STARTED DURING THIS VISIT:  None  Note:  This document was prepared using Dragon voice recognition software and may include unintentional dictation errors.  Alona Bene, MD Emergency Medicine    Long, Arlyss Repress, MD 02/26/17 [phone]

## 2017-02-25 NOTE — ED Provider Notes (Signed)
Patient signed out to me from Dr. Jacqulyn Bath - presented with chest pain and unremarkable ECG, negative initial troponin. Repeat troponin has come back elevated at 0.34. He is already anticoagulated with enoxaparin, so he is started on nitroglycerin drip and given morphine for pain. Case is discussed with Dr. Mayford Knife of cardiology service who requests and main lab troponin. This is come back at 0.8. Dr. Rennis Golden has come to admit the patient.  CRITICAL CARE Performed by: Dione Booze Total critical care time: 35 minutes Critical care time was exclusive of separately billable procedures and treating other patients. Critical care was necessary to treat or prevent imminent or life-threatening deterioration. Critical care was time spent personally by me on the following activities: development of treatment plan with patient and/or surrogate as well as nursing, discussions with consultants, evaluation of patient's response to treatment, examination of patient, obtaining history from patient or surrogate, ordering and performing treatments and interventions, ordering and review of laboratory studies, ordering and review of radiographic studies, pulse oximetry and re-evaluation of patient's condition.    Dione Booze, MD 02/25/17 [phone]

## 2017-02-25 NOTE — ED Notes (Signed)
Critical Lab Value: Critical value read back    Troponin: 0.80

## 2017-02-25 NOTE — Telephone Encounter (Signed)
Patient went to ED per chart review.

## 2017-02-25 NOTE — Telephone Encounter (Signed)
Spoke with patient of Dr. Rennis Golden who reports chest is burning for about 1 hour. He has taken tylenol with no relief.  Advised patient that he should seek ED eval for acute chest pain/burning symptoms, despite have recurrent ED visits for similar symptoms.   He was seen on 9/28 and rx'ed voltaren gel, but he was unable to get voltaren gel prescribed by Dr. Rennis Golden - he needs Rx sent to pharmacy in [place] per phone note on 10/9 but it was sent to [facility]. Rx sent per his request. He is willing to try alternative to voltaren gel - MD note mentions steroid or gabapentin.   Routed to MD

## 2017-02-25 NOTE — Discharge Instructions (Signed)
You were seen in the ED today with chest pain. Your labs were normal and we are starting some medications to treat your symptoms. Call the PCP and your Cardiologist this week for follow up.   Take 2 tabs of your Coumadin for the next 3 days because your INR was low (1.25) and will need to be re-checked this week to ensure that this is coming up.    Return to the ED with any chest pain, difficulty breathing, or sudden weakness/numbness.

## 2017-02-25 NOTE — Telephone Encounter (Signed)
Routed to MD to advise on "gram" or dose (?) for volaren gel Rx

## 2017-02-25 NOTE — ED Notes (Signed)
Pt given turkey sandwich and juice.  

## 2017-02-25 NOTE — Telephone Encounter (Signed)
Ok thanks for informing me.  Dr. Rexene Edison

## 2017-02-25 NOTE — Telephone Encounter (Signed)
Unable to contact pt with chest pain(per operator) at contact humber

## 2017-02-25 NOTE — Telephone Encounter (Signed)
NEw Message  Pt c/o medication issue:  1. Name of Medication: Voltaren Gel  2. How are you currently taking this medication (dosage and times per day)?  Per Gabriel Rung he needs to know the gram for the med  3. Are you having a reaction (difficulty breathing--STAT)? No  4. What is your medication issue? Please call back to discuss

## 2017-02-25 NOTE — H&P (Signed)
ADMISSION HISTORY & PHYSICAL  Patient Name: Robert Mcmahon Date of Encounter: 02/25/2017 Primary Care Physician: Patient, No Pcp Per Cardiologist: Hilty  Chief Complaint   Chest pain  Patient Profile    Robert Mcmahon is a 31 y.o. 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 Jude Masters series mechanical mitral valve and concomitant tricuspid valve annuloplasty with a 28 mm Med[employer]onic contour 3-D ring in [place]. He was transitioning to living in [place]. He presented to the hospital in May with chest pain and shortness of breath. He was found to have an elevated troponin which rose and fell consistent more with an ACS pattern. His INR was subtherapeutic on admission and I was concerned about possibly a coronary thrombus. Although he reported having had heart catheterization prior to valve surgery, we could only find records of his right heart catheterization. My experience is not unusual for young patient did not have left heart catheterization prior to valve surgery given the low likelihood of coronary disease. Based on these findings I thought it prudent for him to undergo left heart catheterization. He did have left heart catheterization with Dr. Tamala Julian on 09/20/2016. This was noted to be difficult procedure from the radial approach. He was not found to have any obstructive coronary disease.  HPI   Robert Mcmahon presents today for evaluation of chest pain. I saw him recently for chest wall pain which was reproducible and worse with breathing and recommended Voltaren Gel - he has vacillated between living in [place] and here and wanted the Rx there, but it wasn't filled. He now presents with somewhat worsening, burning chest pain. This happened spontaneously today at rest and is located across the central chest. There has been concern over compliance issues, mainly due to cost, however he  says he has been taking all of his medication. INR today was subtherapeutic at 1.25. EKG shows borderline anteroseptal ST elevation and inferior and lateral T wave inversion - which is somewhat different compared to his prior EKG in 01/2016, however, it does not meet diagnostic criteria for STEMI. I suspect he may have had coronary spasm versus another thromboembolic event. He is very tearful - he doesn't like being separated from his son in [place] who is 7. He was denied disability. His grandmother who he is staying with apparently has cancer.   PMHx   Past Medical History:  Diagnosis Date  . Chest pain 09/19/2016  . Hypertension     Past Surgical History:  Procedure Laterality Date  . CARDIAC SURGERY  07/29/2016   mitral & tricuspid valve replacement  . LEFT HEART CATH AND CORONARY ANGIOGRAPHY N/A 09/20/2016   Procedure: Left Heart Cath and Coronary Angiography;  Surgeon: Belva Crome, MD;  Location: [facility];  Service: Cardiovascular;  Laterality: N/A;    FAMHx   Family History  Problem Relation Age of Onset  . Heart murmur Mother     SOCHx    reports that he has never smoked. He has never used smokeless tobacco. He reports that he does not drink alcohol or use drugs.  Outpatient Medications   No current facility-administered medications on file prior to encounter.    Current Outpatient Prescriptions on File Prior to Encounter  Medication Sig Dispense Refill  . colchicine 0.6 MG tablet Take 1 tablet (0.6 mg total) by mouth 2 (two) times daily. (Patient taking differently: Take 0.6  mg by mouth 2 (two) times daily as needed (gout attack). ) 60 tablet 3  . docusate sodium (COLACE) 100 MG capsule Take 100 mg by mouth daily as needed for mild constipation.     . metoprolol tartrate (LOPRESSOR) 25 MG tablet Take 25 mg by mouth every morning.  180 tablet 3  . warfarin (COUMADIN) 5 MG tablet Take 1 to 1.5 tablets by mouth daily as directed (Patient taking differently: Take 5  mg by mouth daily. ) 105 tablet 0  . ketorolac (TORADOL) 10 MG tablet Take 10 mg by mouth every 6 (six) hours as needed for severe pain.      Inpatient Medications    Scheduled Meds:   Continuous Infusions:   PRN Meds:    ALLERGIES   No Known Allergies  ROS   Pertinent items noted in HPI and remainder of comprehensive ROS otherwise negative.  Vitals   Vitals:   02/25/17 1806 02/25/17 1815 02/25/17 1900 02/25/17 1917  BP: 127/85 128/87 125/87 140/86  Pulse: 69 68 70 73  Resp: _0 (!) 22  Temp:      TempSrc:      SpO2: 99% 99% 98% 100%    Intake/Output Summary (Last 24 hours) at 02/25/17 1923 Last data filed at 02/25/17 1411  Gross per 24 hour  Intake              500 ml  Output                0 ml  Net              500 ml   There were no vitals filed for this visit.  Physical Exam   General appearance: alert and thin, tearful Neck: no carotid bruit, no JVD and thyroid not enlarged, symmetric, no tenderness/mass/nodules Lungs: clear to auscultation bilaterally Heart: regular rate and rhythm, S1, S2 normal and ejection click present Abdomen: soft, non-tender; bowel sounds normal; no masses,  no organomegaly Extremities: extremities normal, atraumatic, no cyanosis or edema Pulses: 2+ and symmetric Skin: Skin color, texture, turgor normal. No rashes or lesions Neurologic: Grossly normal Psych: Tearful, feels hopeless  Labs   Results for orders placed or performed during the hospital encounter of 02/25/17 (from the past 48 hour(s))  Basic metabolic panel     Status: None   Collection Time: 02/25/17  1:30 PM  Result Value Ref Range   Sodium 136 135 - 145 mmol/L   Potassium 4.1 3.5 - 5.1 mmol/L   Chloride 104 101 - 111 mmol/L   CO2 26 22 - 32 mmol/L   Glucose, Bld 94 65 - 99 mg/dL   BUN 13 6 - 20 mg/dL   Creatinine, Ser 0.89 0.61 - 1.24 mg/dL   Calcium 9.2 8.9 - 10.3 mg/dL   GFR calc non Af Amer >60 >60 mL/min   GFR calc Af Amer >60 >60 mL/min     Comment: (NOTE) The eGFR has been calculated using the CKD EPI equation. This calculation has not been validated in all clinical situations. eGFR's persistently <60 mL/min signify possible Chronic Kidney Disease.    Anion gap 6 5 - 15  CBC with Differential     Status: Abnormal   Collection Time: 02/25/17  1:30 PM  Result Value Ref Range   WBC 5.6 4.0 - 10.5 K/uL   RBC 4.38 4.22 - 5.81 MIL/uL   Hemoglobin 13.0 13.0 - 17.0 g/dL   HCT 38.7 (L) 39.0 - 52.0 %  MCV 88.4 78.0 - 100.0 fL   MCH 29.7 26.0 - 34.0 pg   MCHC 33.6 30.0 - 36.0 g/dL   RDW 13.0 11.5 - 15.5 %   Platelets 260 150 - 400 K/uL   Neutrophils Relative % 57 %   Neutro Abs 3.2 1.7 - 7.7 K/uL   Lymphocytes Relative 36 %   Lymphs Abs 2.0 0.7 - 4.0 K/uL   Monocytes Relative 5 %   Monocytes Absolute 0.3 0.1 - 1.0 K/uL   Eosinophils Relative 2 %   Eosinophils Absolute 0.1 0.0 - 0.7 K/uL   Basophils Relative 0 %   Basophils Absolute 0.0 0.0 - 0.1 K/uL  Protime-INR     Status: Abnormal   Collection Time: 02/25/17  1:30 PM  Result Value Ref Range   Prothrombin Time 15.6 (H) 11.4 - 15.2 seconds   INR 1.25   D-dimer, quantitative     Status: None   Collection Time: 02/25/17  1:30 PM  Result Value Ref Range   D-Dimer, Quant <0.27 0.00 - 0.50 ug/mL-FEU    Comment: (NOTE) At the manufacturer cut-off of 0.50 ug/mL FEU, this assay has been documented to exclude PE with a sensitivity and negative predictive value of 97 to 99%.  At this time, this assay has not been approved by the FDA to exclude DVT/VTE. Results should be correlated with clinical presentation.   I-stat troponin, ED     Status: None   Collection Time: 02/25/17  1:37 PM  Result Value Ref Range   Troponin i, poc 0.05 0.00 - 0.08 ng/mL   Comment 3            Comment: Due to the release kinetics of cTnI, a negative result within the first hours of the onset of symptoms does not rule out myocardial infarction with certainty. If myocardial infarction is still  suspected, repeat the test at appropriate intervals.   I-stat troponin, ED     Status: Abnormal   Collection Time: 02/25/17  5:02 PM  Result Value Ref Range   Troponin i, poc 0.34 (HH) 0.00 - 0.08 ng/mL   Comment NOTIFIED PHYSICIAN    Comment 3            Comment: Due to the release kinetics of cTnI, a negative result within the first hours of the onset of symptoms does not rule out myocardial infarction with certainty. If myocardial infarction is still suspected, repeat the test at appropriate intervals.     ECG   NSR at 64, LAFB, LVH with repolarization changes anteriorly, inferior and lateral TWI's - Personally Reviewed  Telemetry   NSR - Personally Reviewed  Radiology   Dg Chest 2 View  Result Date: 02/25/2017 CLINICAL DATA:  Burning chest.  Shortness of breath. EXAM: CHEST  2 VIEW COMPARISON:  02/05/2017. FINDINGS: Prior median sternotomy and cardiac valve replacements. Cardiomegaly with normal pulmonary vascularity. No focal infiltrate. No pleural effusion or pneumothorax. IMPRESSION: 1. Prior median sternotomy and cardiac valve replacements. Cardiomegaly. No pulmonary venous congestion. 2. No acute pulmonary disease. Electronically Signed   By: Marcello Moores  Register   On: 02/25/2017 14:46    Cardiac Studies   N/A  Assessment   Principal Problem:   NSTEMI (non-ST elevated myocardial infarction) [facility]) Active Problems:   Chest wall pain   H/O mitral valve replacement   H/O tricuspid valve repair   Subtherapeutic international normalized ratio (INR)   Plan   1. Robert Mcmahon had acute onset rest pain and now has an  elevated troponin -this is reminiscent of his admission earlier this year. Cath at that time showed no coronary disease, however, the LM was not "selectively" engaged due to a tortuous aorta. Distal disease could not be excluded. I think it would be very unlikely to have had another low chance embolic event to the coronaries, however, his INR is low. It is not  clear why he is not therapeutic as he reports dietary and medication compliance. He denies substance abuse. He will need to be admitted for heparin bridge to therapeutic INR. Will repeat an echo and trend troponins. If they continue to rise, would advocate for a cardiac CTA to further evaluate the entire coronary tree and r/o any myocardial bridge, congenital coronary disease or distal embolic findings.  Time Spent Directly with Patient:  I have spent a total of 45 minutes with patient reviewing hospital notes, telemetry, EKGs, labs and examining the patient as well as establishing an assessment and plan that was discussed with the patient. > 50% of time was spent in direct patient care.   Length of Stay:  LOS: 0 days   Pixie Casino, MD, [place]  Attending Cardiologist  Direct Dial: [phone]  Fax: [phone]  Website: [email or web address]   Nadean Corwin Hilty 02/25/2017, 7:23 PM

## 2017-02-25 NOTE — Progress Notes (Signed)
ANTICOAGULATION CONSULT NOTE - Initial Consult  Pharmacy Consult for Heparin and Coumadin Indication: mechanical valve  No Known Allergies  Patient Measurements: Weight: 137 lb 12.6 oz (62.5 kg)  Vital Signs: Temp: 98 F (36.7 C) (10/16 1303) Temp Source: Oral (10/16 1303) BP: 100/63 (10/16 2300) Pulse Rate: 92 (10/16 2300)  Labs:  Recent Labs  02/25/17 1330 02/25/17 1750  HGB 13.0  --   HCT 38.7*  --   PLT 260  --   LABPROT 15.6*  --   INR 1.25  --   CREATININE 0.89  --   TROPONINI  --  0.80*    Estimated Creatinine Clearance: 105.3 mL/min (by C-G formula based on SCr of 0.89 mg/dL).   Medical History: Past Medical History:  Diagnosis Date  . Chest pain 09/19/2016  . Hypertension     Medications:  No current facility-administered medications on file prior to encounter.    Current Outpatient Prescriptions on File Prior to Encounter  Medication Sig Dispense Refill  . colchicine 0.6 MG tablet Take 1 tablet (0.6 mg total) by mouth 2 (two) times daily. (Patient taking differently: Take 0.6 mg by mouth 2 (two) times daily as needed (gout attack). ) 60 tablet 3  . docusate sodium (COLACE) 100 MG capsule Take 100 mg by mouth daily as needed for mild constipation.     . metoprolol tartrate (LOPRESSOR) 25 MG tablet Take 25 mg by mouth every morning.  180 tablet 3  . warfarin (COUMADIN) 5 MG tablet Take 1 to 1.5 tablets by mouth daily as directed (Patient taking differently: Take 5 mg by mouth daily. ) 105 tablet 0  . ketorolac (TORADOL) 10 MG tablet Take 10 mg by mouth every 6 (six) hours as needed for severe pain.       Assessment: 31 y.o. male admitted with chest pain, h/o mechanical MVR and subtherapeutic INR, for anticoagulation Goal of Therapy:  INR 2.5-3.5 Heparin level 0.3-0.7 units/ml Monitor platelets by anticoagulation protocol: Yes   Plan:  Heparin 3000 units IV bolus, then start heparin 950 units/hr Check heparin level in 8 hours.   Coumadin 10 mg  now  Eddie Candle 02/25/2017,11:19 PM

## 2017-02-25 NOTE — Telephone Encounter (Signed)
Its 1% gel - 2g topically up to 4 times daily. Apply to affected areas of the chest for chest wall pain.  Dr. Rexene Edison

## 2017-02-25 NOTE — ED Notes (Signed)
Attempted report x1. 

## 2017-02-26 ENCOUNTER — Other Ambulatory Visit (HOSPITAL_COMMUNITY): Payer: Medicaid Other

## 2017-02-26 ENCOUNTER — Observation Stay (HOSPITAL_BASED_OUTPATIENT_CLINIC_OR_DEPARTMENT_OTHER): Payer: Medicaid Other

## 2017-02-26 DIAGNOSIS — R079 Chest pain, unspecified: Secondary | ICD-10-CM | POA: Diagnosis not present

## 2017-02-26 DIAGNOSIS — Z7901 Long term (current) use of anticoagulants: Secondary | ICD-10-CM | POA: Diagnosis not present

## 2017-02-26 DIAGNOSIS — R791 Abnormal coagulation profile: Secondary | ICD-10-CM | POA: Diagnosis not present

## 2017-02-26 DIAGNOSIS — M109 Gout, unspecified: Secondary | ICD-10-CM | POA: Diagnosis present

## 2017-02-26 DIAGNOSIS — I10 Essential (primary) hypertension: Secondary | ICD-10-CM | POA: Diagnosis present

## 2017-02-26 DIAGNOSIS — Z952 Presence of prosthetic heart valve: Secondary | ICD-10-CM | POA: Diagnosis not present

## 2017-02-26 DIAGNOSIS — R748 Abnormal levels of other serum enzymes: Secondary | ICD-10-CM | POA: Diagnosis present

## 2017-02-26 DIAGNOSIS — R0789 Other chest pain: Secondary | ICD-10-CM | POA: Diagnosis present

## 2017-02-26 DIAGNOSIS — Z9889 Other specified postprocedural states: Secondary | ICD-10-CM | POA: Diagnosis not present

## 2017-02-26 DIAGNOSIS — R072 Precordial pain: Secondary | ICD-10-CM | POA: Diagnosis present

## 2017-02-26 DIAGNOSIS — I214 Non-ST elevation (NSTEMI) myocardial infarction: Secondary | ICD-10-CM | POA: Diagnosis not present

## 2017-02-26 DIAGNOSIS — Q249 Congenital malformation of heart, unspecified: Secondary | ICD-10-CM | POA: Diagnosis not present

## 2017-02-26 LAB — LIPID PANEL
CHOLESTEROL: 181 mg/dL (ref 0–200)
HDL: 37 mg/dL — ABNORMAL LOW (ref >40)
LDL CALC: 130 mg/dL — AB (ref 0–99)
TRIGLYCERIDES: 71 mg/dL (ref <150)
Total CHOL/HDL Ratio: 4.9 RATIO
VLDL: 14 mg/dL (ref 0–40)

## 2017-02-26 LAB — BASIC METABOLIC PANEL
Anion gap: 7 (ref 5–15)
BUN: 11 mg/dL (ref 6–20)
CALCIUM: 8.7 mg/dL — AB (ref 8.9–10.3)
CO2: 27 mmol/L (ref 22–32)
Chloride: 102 mmol/L (ref 101–111)
Creatinine, Ser: 0.96 mg/dL (ref 0.61–1.24)
GFR calc Af Amer: >60 mL/min (ref >60)
Glucose, Bld: 92 mg/dL (ref 65–99)
Potassium: 3.8 mmol/L (ref 3.5–5.1)
Sodium: 136 mmol/L (ref 135–145)

## 2017-02-26 LAB — HEPARIN LEVEL (UNFRACTIONATED)
Heparin Unfractionated: 1.08 IU/mL — ABNORMAL HIGH (ref 0.30–0.70)
Heparin Unfractionated: 0.37 IU/mL (ref 0.30–0.70)

## 2017-02-26 LAB — CBC
HEMATOCRIT: 36.9 % — AB (ref 39.0–52.0)
Hemoglobin: 12 g/dL — ABNORMAL LOW (ref 13.0–17.0)
MCH: 28.8 pg (ref 26.0–34.0)
MCHC: 32.5 g/dL (ref 30.0–36.0)
MCV: 88.7 fL (ref 78.0–100.0)
Platelets: 248 10*3/uL (ref 150–400)
RBC: 4.16 MIL/uL — ABNORMAL LOW (ref 4.22–5.81)
RDW: 13 % (ref 11.5–15.5)
WBC: 8.3 10*3/uL (ref 4.0–10.5)

## 2017-02-26 LAB — TROPONIN I
TROPONIN I: 0.94 ng/mL — AB (ref <0.03)
TROPONIN I: 0.59 ng/mL — AB (ref <0.03)
Troponin I: 1.1 ng/mL (ref <0.03)

## 2017-02-26 LAB — ECHOCARDIOGRAM COMPLETE
Height: 67 in
Weight: 2209.6 oz

## 2017-02-26 LAB — PROTIME-INR
INR: 1.48
PROTHROMBIN TIME: 17.8 s — AB (ref 11.4–15.2)

## 2017-02-26 MED ORDER — NITROGLYCERIN IN D5W 200-5 MCG/ML-% IV SOLN: 0.0000 ug/min | INTRAVENOUS | Status: DC

## 2017-02-26 MED ORDER — WARFARIN SODIUM 7.5 MG PO TABS
7.5000 mg | ORAL_TABLET | Freq: Once | ORAL | Status: AC
  Administered 2017-02-26: 7.5 mg via ORAL
  Filled 2017-02-26: qty 1

## 2017-02-26 MED ORDER — DICLOFENAC SODIUM 1 % TD GEL
2.0000 g | Freq: Three times a day (TID) | TRANSDERMAL | Status: DC | PRN
  Administered 2017-02-26: 2 g via TOPICAL
  Filled 2017-02-26: qty 100

## 2017-02-26 MED ORDER — HEPARIN (PORCINE) IN NACL 100-0.45 UNIT/ML-% IJ SOLN
750.0000 [IU]/h | INTRAMUSCULAR | Status: DC
  Administered 2017-02-27: 750 [IU]/h via INTRAVENOUS
  Filled 2017-02-26: qty 250

## 2017-02-26 NOTE — Progress Notes (Signed)
Echo images personally reviewed. There is normal LV function and no wall motion abnormalities. No evidence for valve thrombosis. He did report chest pain with typical rise and fall of troponin concerning for ACS. I personally reviewed his cath film images from 09/2016 - the left coronary artery system could not be selectively engaged from the radial approach. 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 Would recommend CT coronary angiogram as the best way to do this.  Chrystie NoseKenneth C. Hilty, MD, [facility] HospitalFACC  [place]  [facility]  Attending Cardiologist  Direct Dial: [phone]  Fax: [phone]  Website:  [email or web address]

## 2017-02-26 NOTE — Progress Notes (Signed)
ANTICOAGULATION CONSULT NOTE - Follow Up Consult  Pharmacy Consult for Heparin/Coumadin Indication: MVR  No Known Allergies  Patient Measurements: Height: 5\' 7"  (170.2 cm) Weight: 138 lb 1.6 oz (62.6 kg) IBW/kg (Calculated) : 66.1 Heparin Dosing Weight: 62.6 kg  Vital Signs: Temp: 98.1 F (36.7 C) (10/17 0813) Temp Source: Oral (10/17 0813) BP: 109/50 (10/17 0813) Pulse Rate: 99 (10/17 0829)  Labs:  Recent Labs  02/25/17 1330 02/25/17 1750 02/26/17 0000 02/26/17 0532 02/26/17 0720  HGB 13.0  --   --  12.0*  --   HCT 38.7*  --   --  36.9*  --   PLT 260  --   --  248  --   LABPROT 15.6*  --   --  17.8*  --   INR 1.25  --   --  1.48  --   HEPARINUNFRC  --   --   --   --  1.08*  CREATININE 0.89  --   --  0.96  --   TROPONINI  --  0.80* 1.10* 0.94*  --     Estimated Creatinine Clearance: 97.8 mL/min (by C-G formula based on SCr of 0.96 mg/dL).   Assessment:  Anticoag: Coumadin PTA for MVR. Bridge with heparin until INR therapeutic. HL 1.08 elevated. INR 1.25>1.48 today. CBC WNL - PTA Coumadin 5mg  daily with admit INR 1.25.  Goal of Therapy:  INR 2.5-3.5  HL 0.3-0.7 Monitor platelets by anticoagulation protocol: Yes   Plan:  Plan:  Hold heparin x 1 hr then decrease to 750 units/hr. Recheck in 6-8 hrs. Coumadin 7.5mg  po x 1 tonight.  Daily HL, CBC, INR   Skya Mccullum S. Merilynn Finlandobertson, PharmD, BCPS Clinical Staff Pharmacist Pager [phone]  Misty Stanleyobertson, Earlean Fidalgo Stillinger 02/26/2017,8:50 AM

## 2017-02-26 NOTE — Clinical Social Work Note (Signed)
Clinical Social Work Assessment  Patient Details  Name: Robert Mcmahon MRN: [id number] Date of Birth: 09/18/1985  Date of referral:  02/26/17               Reason for consult:  Transportation                Permission sought to share information with:    Permission granted to share information::     Name::        Agency::     Relationship::     Contact Information:     Housing/Transportation Living arrangements for the past 2 months:  Single Family Home Source of Information:  Patient Patient Interpreter Needed:  None Criminal Activity/Legal Involvement Pertinent to Current Situation/Hospitalization:  No - Comment as needed Significant Relationships:  Siblings, Other Family Members Lives with:  Siblings Do you feel safe going back to the place where you live?  Yes Need for family participation in patient care:  No (Coment)  Care giving concerns: Patient from home with brother. Consult for transportation.   Social Worker assessment / plan: CSW met with patient at bedside. Patient does not have problem with transportation, however wanted to discuss SSI and disability. Patient requesting information on appealing SSI determination. Patient also talked about his son and challenges with supporting his son while being sick and having multiple hospital stays. Patient indicated working has been difficult with his medical condition. CSW provided active and empathetic listening. CSW also referred patient to [facility] advocate/law agency. Patient appreciative of resources. Patient has out-of-state Medicaid. CSW advised patient to apply for in-state Medicaid if he plans to remain in [place]. CSW signing off, as no other social work needs indicated. Please re-consult if additional needs arise during patient's admission.  Employment status:  Unemployed, Disabled (Comment on whether or not currently receiving Disability) Insurance information:  [employer] PT Recommendations:  Not assessed at this  time Information / Referral to community resources:  Other (Comment Required) (social security administration)  Patient/Family's Response to care: Patient appreciative of resources.  Patient/Family's Understanding of and Emotional Response to Diagnosis, Current Treatment, and Prognosis: Patient with good understanding of his medical conditions and familiar with his treating physicians. Patient hopeful for improvement in condition, though also pursuing SSI application as he reports working has been difficult with his condition.  Emotional Assessment Appearance:  Appears stated age Attitude/Demeanor/Rapport:  Other (appropriate) Affect (typically observed):  Calm, Pleasant Orientation:  Oriented to Self, Oriented to Place, Oriented to  Time, Oriented to Situation Alcohol / Substance use:  Not Applicable Psych involvement (Current and /or in the community):  No (Comment)  Discharge Needs  Concerns to be addressed:  Other (Comment Required, No discharge needs identified (social security) Readmission within the last 30 days:  No Current discharge risk:  None Barriers to Discharge:  Continued Medical Work up   [employer], LCSW 02/26/2017, 2:30 PM

## 2017-02-26 NOTE — Progress Notes (Signed)
  Echocardiogram 2D Echocardiogram has been performed.  Celene SkeenVijay  Deajah Erkkila 02/26/2017, 1:28 PM

## 2017-02-26 NOTE — Progress Notes (Signed)
ANTICOAGULATION CONSULT NOTE - Follow Up Consult  Pharmacy Consult for Heparin Indication: MVR  No Known Allergies  Patient Measurements: Height: 5\' 7"  (170.2 cm) Weight: 138 lb 1.6 oz (62.6 kg) IBW/kg (Calculated) : 66.1 Heparin Dosing Weight: 62.6 kg  Vital Signs: Temp: 98.8 F (37.1 C) (10/17 1544) Temp Source: Oral (10/17 1544) BP: 108/59 (10/17 1544) Pulse Rate: 99 (10/17 0829)  Labs:  Recent Labs  02/25/17 1330  02/26/17 0000 02/26/17 0532 02/26/17 0720 02/26/17 1213 02/26/17 1519  HGB 13.0  --   --  12.0*  --   --   --   HCT 38.7*  --   --  36.9*  --   --   --   PLT 260  --   --  248  --   --   --   LABPROT 15.6*  --   --  17.8*  --   --   --   INR 1.25  --   --  1.48  --   --   --   HEPARINUNFRC  --   --   --   --  1.08*  --  0.37  CREATININE 0.89  --   --  0.96  --   --   --   TROPONINI  --   < > 1.10* 0.94*  --  0.59*  --   < > = values in this interval not displayed.  Estimated Creatinine Clearance: 97.8 mL/min (by C-G formula based on SCr of 0.96 mg/dL).   Assessment:  Anticoag: Coumadin PTA for MVR. Bridge with heparin until INR therapeutic.  INR 1.25>1.48 today. CBC WNL - PTA Coumadin 5mg  daily with admit INR 1.25.  PM heparin level therapeutic at 0.37  Goal of Therapy:  INR 2.5-3.5  HL 0.3-0.7 Monitor platelets by anticoagulation protocol: Yes   Plan:  Continue heparin at 750 units / hr Coumadin 7.5mg  po x 1 tonight.  Daily HL, CBC, INR  Thank you Okey RegalLisa Benjamim Harnish, PharmD [phone]  02/26/2017,3:59 PM

## 2017-02-26 NOTE — Progress Notes (Signed)
Progress Note  Patient Name: Robert Mcmahon Date of Encounter: 02/26/2017  Primary Cardiologist: Dr. Rennis GoldenHilty  Subjective   Reports still having chest pain, not improved with IV NTG. Tender to palpation on examination. Wearing nasal cannula but reports breathing is at baseline. No palpitations.   Inpatient Medications    Scheduled Meds: . metoprolol tartrate  25 mg Oral Daily  . warfarin  7.5 mg Oral ONCE-1800  . Warfarin - Pharmacist Dosing Inpatient   Does not apply q1800   Continuous Infusions: . heparin    . nitroGLYCERIN 45 mcg/min (02/26/17 0130)   PRN Meds: acetaminophen, nitroGLYCERIN, ondansetron (ZOFRAN) IV   Vital Signs    Vitals:   02/26/17 0500 02/26/17 0600 02/26/17 0813 02/26/17 0829  BP: (!) 104/51 106/61 (!) 109/50   Pulse: 95   99  Resp: 16     Temp: 98.5 F (36.9 C)  98.1 F (36.7 C)   TempSrc:   Oral   SpO2: 99%     Weight: 138 lb 1.6 oz (62.6 kg)     Height:        Intake/Output Summary (Last 24 hours) at 02/26/17 0901 Last data filed at 02/26/17 0700  Gross per 24 hour  Intake            785.6 ml  Output              300 ml  Net            485.6 ml   Filed Weights   02/25/17 2300 02/25/17 2344 02/26/17 0500  Weight: 137 lb 12.6 oz (62.5 kg) 139 lb (63 kg) 138 lb 1.6 oz (62.6 kg)    Telemetry    NSR, HR in 70's - 80's with 5 beats NSVT.  - Personally Reviewed  ECG    NSR, HR 82, with LVH.  - Personally Reviewed  Physical Exam   General: Well developed, well nourished African American male appearing in no acute distress. Head: Normocephalic, atraumatic.  Neck: Supple without bruits, JVD not elevated. Lungs:  Resp regular and unlabored, CTA without wheezing or rales. Heart: RRR, S1, S2, no S3, S4, no rub. Crisp mechanical valve sounds present. Tender to palpation along entire precordium.  Abdomen: Soft, non-tender, non-distended with normoactive bowel sounds. No hepatomegaly. No rebound/guarding. No obvious abdominal  masses. Extremities: No clubbing, cyanosis, or edema. Distal pedal pulses are 2+ bilaterally. Neuro: Alert and oriented X 3. Moves all extremities spontaneously. Psych: Normal affect.  Labs    Chemistry Recent Labs Lab 02/25/17 1330 02/26/17 0532  NA 136 136  K 4.1 3.8  CL 104 102  CO2 26 27  GLUCOSE 94 92  BUN 13 11  CREATININE 0.89 0.96  CALCIUM 9.2 8.7*  GFRNONAA >60 >60  GFRAA >60 >60  ANIONGAP 6 7     Hematology Recent Labs Lab 02/25/17 1330 02/26/17 0532  WBC 5.6 8.3  RBC 4.38 4.16*  HGB 13.0 12.0*  HCT 38.7* 36.9*  MCV 88.4 88.7  MCH 29.7 28.8  MCHC 33.6 32.5  RDW 13.0 13.0  PLT 260 248    Cardiac Enzymes Recent Labs Lab 02/25/17 1750 02/26/17 0000 02/26/17 0532  TROPONINI 0.80* 1.10* 0.94*    Recent Labs Lab 02/25/17 1337 02/25/17 1702  TROPIPOC 0.05 0.34*     BNPNo results for input(s): BNP, PROBNP in the last 168 hours.   DDimer  Recent Labs Lab 02/25/17 1330  DDIMER <0.27     Radiology    Dg Chest  2 View  Result Date: 02/25/2017 CLINICAL DATA:  Burning chest.  Shortness of breath. EXAM: CHEST  2 VIEW COMPARISON:  02/05/2017. FINDINGS: Prior median sternotomy and cardiac valve replacements. Cardiomegaly with normal pulmonary vascularity. No focal infiltrate. No pleural effusion or pneumothorax. IMPRESSION: 1. Prior median sternotomy and cardiac valve replacements. Cardiomegaly. No pulmonary venous congestion. 2. No acute pulmonary disease. Electronically Signed   By: Maisie Fus  Register   On: 02/25/2017 14:46    Cardiac Studies   Cardiac Catheterization: 09/2016  LV end diastolic pressure is normal.    Very difficult procedure from the left radial due to small radial artery and short aortic arch. No right radial is palpable. Unable to perform from femoral approach due to INR of 1.75 earlier this afternoon.  Left main could not be selectively engaged, however subselective imaging demonstrated a widely patent left main, proximal  to distal LAD, and proximal to mid circumflex.  The right coronary is dominant and normal.  No evidence of proximal coronary obstruction. Cannot exclude distal embolic event from the patient's valve with this study.  RECOMMENDATIONS:   Resume Coumadin  Reason for elevated cardiac markers is unclear. No evidence of obstructive coronary disease.   Echocardiogram: 09/2016 Study Conclusions  - Left ventricle: The cavity size was normal. Systolic function was   normal. The estimated ejection fraction was in the range of 55%   to 60%. The study is not technically sufficient to allow   evaluation of LV diastolic function. - Aortic valve: There was moderate regurgitation. - Mitral valve: A mechanical prosthesis was present. The prosthesis   had a normal range of motion. Valve area by continuity equation   (using LVOT flow): 1.42 cm^2. - Atrial septum: Prior repair of ASD.   Patient Profile     31 y.o. 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   Assessment & Plan    1. 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  - echo in 09/2016 showed a preserved EF of 55-60% with normal function of the mechanical prosthesis. Repeat echocardiogram is scheduled to be performed today.  - INR was subtherapeutic 1.25 on admission. Reports missing a few doses of Coumadin but doubling the dose to account for this. Being bridged with Heparin to achieve therapeutic INR. At 1.48 this AM. Appreciate Pharmacy's assistance.   2. NSTEMI - cyclic troponin values have been at 0.80, 1.10, and 0.94. Cath was performed in 09/2016 and the LM could not be selectively engaged, however  subselective imaging demonstrated a widely patent left main, proximal to distal LAD, proximal to mid circumflex, and normal RCA.  - consider Coronary CT to assess for myocardial bridge or distal emboli in the setting of his subtherapeutic INR.  - he is still having chest pain which is worse on palpation and has not improved with IV NTG. Wean NTG drip. Will restart PTA Voltaren gel. Consider initiation of Gabapentin if no relief with Voltaren.   3. HTN  - BP is well-controlled at 99/50 - 142/96. - continue Lopressor. Only taking once daily PTA.   Signed, Ellsworth Lennox , PA-C 9:01 AM 02/26/2017 Pager: [phone]

## 2017-02-27 ENCOUNTER — Inpatient Hospital Stay (HOSPITAL_COMMUNITY): Payer: Medicaid Other

## 2017-02-27 DIAGNOSIS — R072 Precordial pain: Secondary | ICD-10-CM

## 2017-02-27 DIAGNOSIS — Q249 Congenital malformation of heart, unspecified: Secondary | ICD-10-CM

## 2017-02-27 DIAGNOSIS — R079 Chest pain, unspecified: Secondary | ICD-10-CM

## 2017-02-27 LAB — PROTIME-INR
INR: 2.43
PROTHROMBIN TIME: 26.3 s — AB (ref 11.4–15.2)

## 2017-02-27 LAB — CBC
HCT: 36.4 % — ABNORMAL LOW (ref 39.0–52.0)
Hemoglobin: 12.6 g/dL — ABNORMAL LOW (ref 13.0–17.0)
MCH: 30.5 pg (ref 26.0–34.0)
MCHC: 34.6 g/dL (ref 30.0–36.0)
MCV: 88.1 fL (ref 78.0–100.0)
PLATELETS: 236 10*3/uL (ref 150–400)
RBC: 4.13 MIL/uL — AB (ref 4.22–5.81)
RDW: 13.1 % (ref 11.5–15.5)
WBC: 6.7 10*3/uL (ref 4.0–10.5)

## 2017-02-27 LAB — HEPARIN LEVEL (UNFRACTIONATED): Heparin Unfractionated: 0.33 IU/mL (ref 0.30–0.70)

## 2017-02-27 MED ORDER — IOPAMIDOL (ISOVUE-370) INJECTION 76%
INTRAVENOUS | Status: AC
  Administered 2017-02-27: 80 mL
  Filled 2017-02-27: qty 100

## 2017-02-27 MED ORDER — WARFARIN SODIUM 2 MG PO TABS
2.0000 mg | ORAL_TABLET | Freq: Once | ORAL | Status: AC
  Administered 2017-02-27: 2 mg via ORAL
  Filled 2017-02-27: qty 1

## 2017-02-27 MED ORDER — NITROGLYCERIN 0.4 MG SL SUBL: 0.4000 mg | SUBLINGUAL_TABLET | SUBLINGUAL | Status: DC | PRN

## 2017-02-27 MED ORDER — METOPROLOL TARTRATE 5 MG/5ML IV SOLN
5.0000 mg | Freq: Four times a day (QID) | INTRAVENOUS | Status: DC | PRN
  Administered 2017-02-27: 5 mg via INTRAVENOUS
  Filled 2017-02-27: qty 5

## 2017-02-27 MED ORDER — GABAPENTIN 600 MG PO TABS
300.0000 mg | ORAL_TABLET | Freq: Two times a day (BID) | ORAL | Status: DC
  Administered 2017-02-27 – 2017-02-28 (×3): 300 mg via ORAL
  Filled 2017-02-27 (×3): qty 1

## 2017-02-27 MED ORDER — NITROGLYCERIN 0.4 MG SL SUBL
SUBLINGUAL_TABLET | SUBLINGUAL | Status: AC
  Administered 2017-02-27: 0.4 mg
  Filled 2017-02-27: qty 1

## 2017-02-27 NOTE — Care Management Note (Signed)
Case Management Note  Patient Details  Name: Frutoso SchatzCalvin Tomb MRN: [id number] Date of Birth: [birth year]  Subjective/Objective: Pt presented for Chest Pain-history significant for congenital mitral valve disease. Has Medicaid  Advantage Plan (BCBS) from [place]. [place] with the support of his mother.    Action/Plan: Pt without PCP- CM did provide pt with Resources for PCP- pt to call and get established if he continues to stay in [place]. No further needs from CM @ this time.   Expected Discharge Date:                  Expected Discharge Plan:  Home/Self Care  In-House Referral:  NA  Discharge planning Services  CM Consult, [facility] (Resources for PCP provided to patient. )  Post Acute Care Choice:  NA Choice offered to:  NA  DME Arranged:  N/A DME Agency:  NA  HH Arranged:  NA HH Agency:  NA  Status of Service:  Completed, signed off  If discussed at Long Length of Stay Meetings, dates discussed:    Additional Comments:  Gala LewandowskyGraves-Bigelow, Emelee Rodocker Kaye, RN 02/27/2017, 3:16 PM

## 2017-02-27 NOTE — Progress Notes (Signed)
Progress Note  Patient Name: Robert Mcmahon Date of Encounter: 02/27/2017  Primary Cardiologist: Dr. Rennis Golden  Subjective   Still having chest pain but reports this has improved from admission (10/10 to 4/10). Requesting IV Morphine. Breathing is at baseline. Has many questions about Coumadin and his mechanical valve which were addressed during this encounter.   Inpatient Medications    Scheduled Meds: . metoprolol tartrate  25 mg Oral Daily  . Warfarin - Pharmacist Dosing Inpatient   Does not apply q1800   Continuous Infusions: . heparin 750 Units/hr (02/27/17 0412)   PRN Meds: acetaminophen, diclofenac sodium, nitroGLYCERIN, ondansetron (ZOFRAN) IV   Vital Signs    Vitals:   02/26/17 1127 02/26/17 1544 02/26/17 1936 02/27/17 0428  BP: 106/63 (!) 108/59 120/63 (!) 110/55  Pulse:   90 77  Resp:    (!) 26  Temp: 98.9 F (37.2 C) 98.8 F (37.1 C) 98.4 F (36.9 C) 97.9 F (36.6 C)  TempSrc: Oral Oral Oral Oral  SpO2:   100% 100%  Weight:    136 lb 11.2 oz (62 kg)  Height:        Intake/Output Summary (Last 24 hours) at 02/27/17 0714 Last data filed at 02/27/17 0600  Gross per 24 hour  Intake              150 ml  Output             1620 ml  Net            -1470 ml   Filed Weights   02/25/17 2344 02/26/17 0500 02/27/17 0428  Weight: 139 lb (63 kg) 138 lb 1.6 oz (62.6 kg) 136 lb 11.2 oz (62 kg)    Telemetry    NSR, HR in 70's - 80's. No ectopic events.  - Personally Reviewed  ECG    1st degree AV Block, HR 88, LVH, and nonspecific ST changes along V1-V2.  - Personally Reviewed  Physical Exam   General: Well developed, well nourished African American male appearing in no acute distress. Head: Normocephalic, atraumatic.  Neck: Supple without bruits, JVD not elevated. Lungs:  Resp regular and unlabored, CTA without wheezing or rales. Heart: RRR, S1, S2, no S3, S4, or murmur; no rub. Crisp valve sounds.  Abdomen: Soft, non-tender, non-distended with  normoactive bowel sounds. No hepatomegaly. No rebound/guarding. No obvious abdominal masses. Extremities: No clubbing, cyanosis, or edema. Distal pedal pulses are 2+ bilaterally. Neuro: Alert and oriented X 3. Moves all extremities spontaneously. Psych: Normal affect.  Labs    Chemistry Recent Labs Lab 02/25/17 1330 02/26/17 0532  NA 136 136  K 4.1 3.8  CL 104 102  CO2 26 27  GLUCOSE 94 92  BUN 13 11  CREATININE 0.89 0.96  CALCIUM 9.2 8.7*  GFRNONAA >60 >60  GFRAA >60 >60  ANIONGAP 6 7     Hematology Recent Labs Lab 02/25/17 1330 02/26/17 0532 02/27/17 0254  WBC 5.6 8.3 6.7  RBC 4.38 4.16* 4.13*  HGB 13.0 12.0* 12.6*  HCT 38.7* 36.9* 36.4*  MCV 88.4 88.7 88.1  MCH 29.7 28.8 30.5  MCHC 33.6 32.5 34.6  RDW 13.0 13.0 13.1  PLT 260 248 236    Cardiac Enzymes Recent Labs Lab 02/25/17 1750 02/26/17 0000 02/26/17 0532 02/26/17 1213  TROPONINI 0.80* 1.10* 0.94* 0.59*    Recent Labs Lab 02/25/17 1337 02/25/17 1702  TROPIPOC 0.05 0.34*     BNPNo results for input(s): BNP, PROBNP in the last 168 hours.  DDimer  Recent Labs Lab 02/25/17 1330  DDIMER <0.27     Radiology    Dg Chest 2 View  Result Date: 02/25/2017 CLINICAL DATA:  Burning chest.  Shortness of breath. EXAM: CHEST  2 VIEW COMPARISON:  02/05/2017. FINDINGS: Prior median sternotomy and cardiac valve replacements. Cardiomegaly with normal pulmonary vascularity. No focal infiltrate. No pleural effusion or pneumothorax. IMPRESSION: 1. Prior median sternotomy and cardiac valve replacements. Cardiomegaly. No pulmonary venous congestion. 2. No acute pulmonary disease. Electronically Signed   By: Maisie Fushomas  Register   On: 02/25/2017 14:46    Cardiac Studies   Echocardiogram: 02/26/2017 Study Conclusions  - Left ventricle: The cavity size was normal. Wall thickness was   normal. Systolic function was normal. The estimated ejection   fraction was in the range of 60% to 65%. Wall motion was  normal;   there were no regional wall motion abnormalities. The study is   not technically sufficient to allow evaluation of LV diastolic   function. - Aortic valve: There was trivial regurgitation. - Mitral valve: A mechanical prosthesis was present. - Tricuspid valve: Prior procedures included surgical repair.  Impressions:  - Normal LV systolic function; trace AI; s/p MVR with normal mean   gradient of 5 mmHg and trace MR; s/p TV repair with trace TR.  Patient Profile     31 y.o. 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   Assessment & Plan    1. 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  - echo in 09/2016 showed a preserved EF of 55-60% with normal function of the mechanical prosthesis. Repeat echo yesterday showed a preserved EF of 60-65% with no regional WMA and s/p MVR with mean gradient of 5 mm Hg.   - INR was subtherapeutic 1.25 on admission. Reports missing a few doses of Coumadin but doubling the dose to account for this. Being bridged with Heparin to achieve therapeutic INR. At 2.43 this AM (goal 2.5 - 3.5). Appreciate Pharmacy's assistance.   2. NSTEMI/ Atypical Chest Pain - cyclic troponin values have been at 0.80, 1.10, and 0.94. Cath was performed in 09/2016 and the LM could not be selectively engaged, however subselective imaging demonstrated a widely patent left main, proximal to distal LAD, proximal to mid circumflex, and normal RCA.  - will order Coronary CT to evaluate for congenital abnormalities as recommended by Dr. Rennis GoldenHilty yesterday.  - he reports no improvement in his pain with Voltaren gel. Will start Gabapentin at 300mg  BID  which can be titrated.   3. HTN  - BP is well-controlled at 106/50 - 120/63. - continue Lopressor. Only taking once daily PTA.   Signed, Ellsworth LennoxBrittany M Onyinyechi Huante , PA-C 7:14 AM 02/27/2017 Pager: [phone]

## 2017-02-27 NOTE — Progress Notes (Signed)
ANTICOAGULATION CONSULT NOTE - Follow Up Consult  Pharmacy Consult for Coumadin Indication: mech MVR  No Known Allergies  Patient Measurements: Height: 5\' 7"  (170.2 cm) Weight: 136 lb 11.2 oz (62 kg) IBW/kg (Calculated) : 66.1  Vital Signs: Temp: 97.9 F (36.6 C) (10/18 0428) Temp Source: Oral (10/18 0428) BP: 110/55 (10/18 0428) Pulse Rate: 77 (10/18 0428)  Labs:  Recent Labs  02/25/17 1330  02/26/17 0000 02/26/17 0532 02/26/17 0720 02/26/17 1213 02/26/17 1519 02/27/17 0254  HGB 13.0  --   --  12.0*  --   --   --  12.6*  HCT 38.7*  --   --  36.9*  --   --   --  36.4*  PLT 260  --   --  248  --   --   --  236  LABPROT 15.6*  --   --  17.8*  --   --   --  26.3*  INR 1.25  --   --  1.48  --   --   --  2.43  HEPARINUNFRC  --   --   --   --  1.08*  --  0.37 0.33  CREATININE 0.89  --   --  0.96  --   --   --   --   TROPONINI  --   < > 1.10* 0.94*  --  0.59*  --   --   < > = values in this interval not displayed.  Estimated Creatinine Clearance: 96.9 mL/min (by C-G formula based on SCr of 0.96 mg/dL).   Assessment:  Anticoag: Coumadin PTA for MVR. Bridge with heparin until INR therapeutic. INR 1.25>1.48>2.43 today. CBC WNL. HL 0.33 in goal range.  - PTA Coumadin 5mg  daily with admit INR 1.25. - PA note 10/18 says pt had many questions about Coumadin. Reports missing a few doses of Coumadin but doubling the dose to account for this  Goal of Therapy:  Heparin level 0.3-0.7 units/ml  INR 2.5-3.5 Monitor platelets by anticoagulation protocol: Yes   Plan:  Continue heparin at 750 units / hr until INR>2.5 Decrease Coumadin 2mg  tonight, hopefully can resume home dose tomorrow. Daily HL, CBC, INR Will re-educate on Coumadin   Emmanuel Ercole S. Merilynn Finlandobertson, PharmD, [facility] Clinical Staff Pharmacist Pager [phone]  Misty Stanleyobertson, Shalik Sanfilippo Stillinger 02/27/2017,8:15 AM

## 2017-02-28 LAB — CBC
HCT: 36.6 % — ABNORMAL LOW (ref 39.0–52.0)
Hemoglobin: 12.2 g/dL — ABNORMAL LOW (ref 13.0–17.0)
MCH: 29.7 pg (ref 26.0–34.0)
MCHC: 33.3 g/dL (ref 30.0–36.0)
MCV: 89.1 fL (ref 78.0–100.0)
Platelets: 263 10*3/uL (ref 150–400)
RBC: 4.11 MIL/uL — ABNORMAL LOW (ref 4.22–5.81)
RDW: 13.1 % (ref 11.5–15.5)
WBC: 5.5 10*3/uL (ref 4.0–10.5)

## 2017-02-28 LAB — PROTIME-INR
INR: 2.51
Prothrombin Time: 26.9 seconds — ABNORMAL HIGH (ref 11.4–15.2)

## 2017-02-28 LAB — HEPARIN LEVEL (UNFRACTIONATED): HEPARIN UNFRACTIONATED: 0.22 [IU]/mL — AB (ref 0.30–0.70)

## 2017-02-28 MED ORDER — PANTOPRAZOLE SODIUM 20 MG PO TBEC: 20.0000 mg | DELAYED_RELEASE_TABLET | Freq: Every day | ORAL | 0 refills | Status: DC

## 2017-02-28 MED ORDER — SUCRALFATE 1 GM/10ML PO SUSP: 1.0000 g | Freq: Three times a day (TID) | ORAL | 0 refills | Status: DC

## 2017-02-28 MED ORDER — GABAPENTIN 600 MG PO TABS: 300.0000 mg | ORAL_TABLET | Freq: Two times a day (BID) | ORAL | 0 refills | Status: DC

## 2017-02-28 MED ORDER — NITROGLYCERIN 0.4 MG SL SUBL: 0.4000 mg | SUBLINGUAL_TABLET | SUBLINGUAL | 12 refills | Status: AC | PRN

## 2017-02-28 MED ORDER — COLCHICINE 0.6 MG PO TABS: 0.6000 mg | ORAL_TABLET | Freq: Two times a day (BID) | ORAL | 0 refills | Status: DC | PRN

## 2017-02-28 NOTE — Discharge Summary (Signed)
Discharge Summary    Patient ID: Robert Mcmahon,  MRN: [id number], DOB/AGE: December 26, 1985 [age] y.o.  Admit date: 02/25/2017 Discharge date: 02/28/2017  Primary Care Provider: Patient, No Pcp Per Primary Cardiologist: Dr Rennis GoldenHilty  Discharge Diagnoses    Principal Problem:   NSTEMI (non-ST elevated myocardial infarction) [facility](HCC) Active Problems:   Chest wall pain   H/O mitral valve replacement   H/O tricuspid valve repair   Subtherapeutic international normalized ratio (INR)   Congenital heart disease   Precordial chest pain   Allergies No Known Allergies  Diagnostic Studies/Procedures    Echocardiogram: 02/26/2017 Study Conclusions - Left ventricle: The cavity size was normal. Wall thickness was normal. Systolic function was normal. The estimated ejection fraction was in the range of 60% to 65%. Wall motion was normal; there were no regional wall motion abnormalities. The study is not technically sufficient to allow evaluation of LV diastolicfunction. - Aortic valve: There was trivial regurgitation. - Mitral valve: A mechanical prosthesis was present. - Tricuspid valve: Prior procedures included surgical repair. Impressions: - Normal LV systolic function; trace AI; s/p MVR with normal mean gradient of 5 mmHg and trace MR; s/p TV repair with trace TR.  CARDIAC CTA RESULTS BELOW _____________   History of Present Illness     31 y.o. 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    His troponin was elevated and he was admitted for possible NSTEMI.   Hospital Course     Consultants: none   1. Mechanical Mitral Valve - he is s/p MVR with 29 mm St. Jude mechanical valve and tricuspid valve annuloplasty in 07/2016 performed in the setting of congenital mitral valve  disease.  - echo in 09/2016 showed a preserved EF of 55-60% with normal function of the mechanical prosthesis.  - Repeat echo 10/18 showed a preserved EF of 60-65% with no regional WMA and s/p MVR with mean gradient of 5 mm Hg.   - INR was subtherapeutic 1.25 on admission. Reports missing a few doses of Coumadin but doubling the dose to account for this. Being bridged with Heparin to achieve therapeutic INR. Appreciate Pharmacy's assistance.  -INR therapeutic yesterday and today, he can be discharged home today. - early f/u in office for INR   2. NSTEMI/ Atypical Chest Pain - cyclic troponin values have been at 0.80, 1.10, and 0.94. Cath was performed in 09/2016 and theLMcould not be selectively engaged, however subselective imaging demonstrated a widely patent left main, proximal to distal LAD, proximal to mid circumflex, and normal RCA.  - Coronary CT ordered to evaluate for congenital abnormalities, results are below, no CAD, no significant congenital abnormalities. No further cardiac evaluation indicated.  - Started Gabapentin at 300mg  BID yesterday - would Rx for him to use at home - he feels this is helping him. Will give him a 30 day supply.   3. HTN  - BP is well-controlled at 106/50 - 120/63. - continue Lopressor. Only taking once daily PTA so will continue this.  4. Hx Gout - pt had been taking colchicine prn for gout attacks. - continue this as a prn med, needs PCP for refills.  Plan: Pt has ambulated and is doing better. OK for d/c, f/u arranged.   _____________  Discharge Vitals Blood pressure 119/69, pulse  88, temperature 97.8 F (36.6 C), temperature source Oral, resp. rate (!) 26, height 5\' 7"  (1.702 m), weight 139 lb 12.8 oz (63.4 kg), SpO2 100 %.  Filed Weights   02/26/17 0500 02/27/17 0428 02/28/17 0504  Weight: 138 lb 1.6 oz (62.6 kg) 136 lb 11.2 oz (62 kg) 139 lb 12.8 oz (63.4 kg)    Labs & Radiologic Studies    CBC  Recent Labs  02/27/17 0254  02/28/17 0726  WBC 6.7 5.5  HGB 12.6* 12.2*  HCT 36.4* 36.6*  MCV 88.1 89.1  PLT 236 263   Basic Metabolic Panel  Recent Labs  02/26/17 0532  NA 136  K 3.8  CL 102  CO2 27  GLUCOSE 92  BUN 11  CREATININE 0.96  CALCIUM 8.7*   Cardiac Enzymes  Recent Labs  02/26/17 0000 02/26/17 0532 02/26/17 1213  TROPONINI 1.10* 0.94* 0.59*   Fasting Lipid Panel  Recent Labs  02/26/17 0532  CHOL 181  HDL 37*  LDLCALC 130*  TRIG 71  CHOLHDL 4.9   Lab Results  Component Value Date   INR 2.51 02/28/2017   INR 2.43 02/27/2017   INR 1.48 02/26/2017    _____________  Dg Chest 2 View  Result Date: 02/25/2017 CLINICAL DATA:  Burning chest.  Shortness of breath. EXAM: CHEST  2 VIEW COMPARISON:  02/05/2017. FINDINGS: Prior median sternotomy and cardiac valve replacements. Cardiomegaly with normal pulmonary vascularity. No focal infiltrate. No pleural effusion or pneumothorax. IMPRESSION: 1. Prior median sternotomy and cardiac valve replacements. Cardiomegaly. No pulmonary venous congestion. 2. No acute pulmonary disease. Electronically Signed   By: Maisie Fus  Register   On: 02/25/2017 14:46   Dg Chest 2 View  Result Date: 02/05/2017 CLINICAL DATA:  Chest pain. EXAM: CHEST  2 VIEW COMPARISON:  Radiographs of December 22, 2016. FINDINGS: The heart size and mediastinal contours are within normal limits. Status post cardiac valve repair. No pneumothorax or pleural effusion is noted. Both lungs are clear. The visualized skeletal structures are unremarkable. IMPRESSION: No active cardiopulmonary disease. Electronically Signed   By: Lupita Raider, M.D.   On: 02/05/2017 10:53   Ct Coronary Morph W/cta Cor W/score W/ca W/cm &/or Wo/cm  Addendum Date: 02/27/2017   ADDENDUM REPORT: 02/27/2017 16:44 CLINICAL DATA:  Chest pain EXAM: Cardiac CTA MEDICATIONS: Sub lingual nitro. 4mg  TECHNIQUE: The patient was scanned on a Siemens 192 slice scanner. Gantry rotation speed was 240 msecs. Collimation was  0.6 mm. A 100 kV prospective scan was triggered in the ascending thoracic aorta at 111 HU's centered around 65-75% of the R-R interval. Average HR during the scan was 60 bpm. The 3D data set was interpreted on a dedicated work station using MPR, MIP and VRT modes. A total of 80cc of contrast was used. FINDINGS: Non-cardiac: See separate report from [facility]. There is a Futures trader mitral valve and the patient appears to have a tricuspid valve annuloplasty ring. Calcium Score:  0 Agatston units. Coronary Arteries: Right dominant with no anomalies LM:  No plaque or stenosis. LAD system:  No plaque or stenosis. Circumflex system: Moderate ramus present. No plaque or stenosis in LCx system. RCA system:  No plaque or stenosis. IMPRESSION: 1. Coronary artery calcium score 0 Agatston units, suggesting low risk for future cardiac events. 2.  No plaque or stenosis noted in the coronary arteries. Dalton Mclean Electronically Signed   By: Marca Ancona M.D.   On: 02/27/2017 16:44   Result Date: 02/27/2017 EXAM: OVER-READ INTERPRETATION  CT CHEST The following report is an over-read performed by radiologist Dr. Noe Gens [facility] on 02/27/2017. This over-read does not include interpretation of cardiac or coronary anatomy or pathology. The coronary CTA interpretation by the cardiologist is attached. COMPARISON:  None. FINDINGS: Cardiovascular: Prior median sternotomy. Heart is upper limits normal in size. Aorta is normal caliber. Mediastinum/Nodes: No adenopathy in the lower mediastinum or hila. Lungs/Pleura: Trace left pleural effusion. Dependent bibasilar atelectasis. Upper Abdomen: Imaging into the upper abdomen shows no acute findings. Musculoskeletal: Chest wall soft tissues are unremarkable. No acute bony abnormality. IMPRESSION: Trace left pleural effusion.  Bibasilar atelectasis. Electronically Signed: By: Charlett Nose M.D. On: 02/27/2017 13:07   Disposition   Pt is being  discharged home today in good condition.  Follow-up Plans & Appointments    Follow-up Information    [facility]. Call in 1 day(s).   Contact information: [address] [place] [address] [phone]       Chrystie Nose, MD Follow up on 03/31/2017.   Specialty:  Cardiology Why:  Please arrive at 2:45 pm for a 3:00 pm appt. Contact information: [name] [place] [address] [phone]        [facility] Follow up on 03/03/2017.   Specialty:  Cardiology Why:  Coumadin check at 9:00 am. Please arrive 15 minutes early. Contact information: [address] [place] [address] [phone]         Discharge Instructions    Diet - low sodium heart healthy    Complete by:  As directed    Increase activity slowly    Complete by:  As directed       Discharge Medications   Current Discharge Medication List    START taking these medications   Details  gabapentin (NEURONTIN) 600 MG tablet Take 0.5 tablets (300 mg total) by mouth 2 (two) times daily. Qty: 60 tablet, Refills: 0    nitroGLYCERIN (NITROSTAT) 0.4 MG SL tablet Place 1 tablet (0.4 mg total) under the tongue every 5 (five) minutes x 3 doses as needed for chest pain. Qty: 25 tablet, Refills: 12    pantoprazole (PROTONIX) 20 MG tablet Take 1 tablet (20 mg total) by mouth daily. Qty: 30 tablet, Refills: 0    sucralfate (CARAFATE) 1 GM/10ML suspension Take 10 mLs (1 g total) by mouth 4 (four) times daily -  with meals and at bedtime. Qty: 420 mL, Refills: 0      CONTINUE these medications which have CHANGED   Details  colchicine 0.6 MG tablet Take 1 tablet (0.6 mg total) by mouth 2 (two) times daily as needed (gout attack). Qty: 20 tablet, Refills: 0      CONTINUE these medications which have NOT CHANGED   Details  acetaminophen (TYLENOL) 325 MG tablet Take 975 mg by mouth every 6 (six)  hours as needed for moderate pain.    docusate sodium (COLACE) 100 MG capsule Take 100 mg by mouth daily as needed for mild constipation.     metoprolol tartrate (LOPRESSOR) 25 MG tablet Take 25 mg by mouth every morning.  Qty: 180 tablet, Refills: 3    warfarin (COUMADIN) 5 MG tablet Take 1 to 1.5 tablets by mouth daily as directed Qty: 105 tablet, Refills: 0    ketorolac (TORADOL) 10 MG tablet Take 10 mg by mouth every 6 (six) hours as needed for severe pain.  Aspirin prescribed at discharge?  No: no acute vessel closure High Intensity Statin Prescribed? (Lipitor 40-80mg  or Crestor 20-40mg ): No: no acute vessel closure Beta Blocker Prescribed? Yes For EF <40%, was ACEI/ARB Prescribed? No: no acute vessel closure ADP Receptor Inhibitor Prescribed? (i.e. Plavix etc.-Includes Medically Managed Patients): No: no acute vessel closure For EF <40%, Aldosterone Inhibitor Prescribed? No: no acute vessel closure Was EF assessed during THIS hospitalization? Yes Was Cardiac Rehab II ordered? (Included Medically managed Patients): No: no acute vessel closure   Outstanding Labs/Studies   None  Duration of Discharge Encounter   Greater than 30 minutes including physician time.  Melida Quitter NP 02/28/2017, 2:55 PM

## 2017-02-28 NOTE — Progress Notes (Signed)
Progress Note  Patient Name: Robert Mcmahon Date of Encounter: 02/28/2017  Primary Cardiologist: Dr. Rennis GoldenHilty  Subjective   Chest pain has almost resolved. Cardiac CTA yesterday shows no coronary disease, dissection or congenital coronary anomalies to explain elevated troponin.  Inpatient Medications    Scheduled Meds: . gabapentin  300 mg Oral BID  . metoprolol tartrate  25 mg Oral Daily  . Warfarin - Pharmacist Dosing Inpatient   Does not apply q1800   Continuous Infusions: . heparin 750 Units/hr (02/28/17 0700)   PRN Meds: acetaminophen, metoprolol tartrate, nitroGLYCERIN, ondansetron (ZOFRAN) IV   Vital Signs    Vitals:   02/27/17 0924 02/27/17 1235 02/27/17 2035 02/28/17 0504  BP:  108/68 133/68 (!) 111/59  Pulse: 92  93 71  Resp:      Temp:  98.2 F (36.8 C) 98.2 F (36.8 C) 97.8 F (36.6 C)  TempSrc:  Oral Oral Oral  SpO2:   99% 100%  Weight:    139 lb 12.8 oz (63.4 kg)  Height:        Intake/Output Summary (Last 24 hours) at 02/28/17 0828 Last data filed at 02/28/17 0700  Gross per 24 hour  Intake           677.51 ml  Output             2225 ml  Net         -1547.49 ml   Filed Weights   02/26/17 0500 02/27/17 0428 02/28/17 0504  Weight: 138 lb 1.6 oz (62.6 kg) 136 lb 11.2 oz (62 kg) 139 lb 12.8 oz (63.4 kg)    Telemetry    NSR  - Personally Reviewed  ECG    N/A  Physical Exam   General appearance: alert and no distress Lungs: clear to auscultation bilaterally Heart: regular rate and rhythm Extremities: extremities normal, atraumatic, no cyanosis or edema Neurologic: Grossly normal   Labs    Chemistry  Recent Labs Lab 02/25/17 1330 02/26/17 0532  NA 136 136  K 4.1 3.8  CL 104 102  CO2 26 27  GLUCOSE 94 92  BUN 13 11  CREATININE 0.89 0.96  CALCIUM 9.2 8.7*  GFRNONAA >60 >60  GFRAA >60 >60  ANIONGAP 6 7     Hematology  Recent Labs Lab 02/26/17 0532 02/27/17 0254 02/28/17 0726  WBC 8.3 6.7 5.5  RBC 4.16* 4.13* 4.11*   HGB 12.0* 12.6* 12.2*  HCT 36.9* 36.4* 36.6*  MCV 88.7 88.1 89.1  MCH 28.8 30.5 29.7  MCHC 32.5 34.6 33.3  RDW 13.0 13.1 13.1  PLT 248 236 263    Cardiac Enzymes  Recent Labs Lab 02/25/17 1750 02/26/17 0000 02/26/17 0532 02/26/17 1213  TROPONINI 0.80* 1.10* 0.94* 0.59*     Recent Labs Lab 02/25/17 1337 02/25/17 1702  TROPIPOC 0.05 0.34*     BNPNo results for input(s): BNP, PROBNP in the last 168 hours.   DDimer   Recent Labs Lab 02/25/17 1330  DDIMER <0.27     Radiology    Ct Coronary Morph W/cta Cor W/score W/ca W/cm &/or Wo/cm  Addendum Date: 02/27/2017   ADDENDUM REPORT: 02/27/2017 16:44 CLINICAL DATA:  Chest pain EXAM: Cardiac CTA MEDICATIONS: Sub lingual nitro. 4mg  TECHNIQUE: The patient was scanned on a Siemens 192 slice scanner. Gantry rotation speed was 240 msecs. Collimation was 0.6 mm. A 100 kV prospective scan was triggered in the ascending thoracic aorta at 111 HU's centered around 65-75% of the R-R interval. Average HR during  the scan was 60 bpm. The 3D data set was interpreted on a dedicated work station using MPR, MIP and VRT modes. A total of 80cc of contrast was used. FINDINGS: Non-cardiac: See separate report from [facility]. There is a Futures trader mitral valve and the patient appears to have a tricuspid valve annuloplasty ring. Calcium Score:  0 Agatston units. Coronary Arteries: Right dominant with no anomalies LM:  No plaque or stenosis. LAD system:  No plaque or stenosis. Circumflex system: Moderate ramus present. No plaque or stenosis in LCx system. RCA system:  No plaque or stenosis. IMPRESSION: 1. Coronary artery calcium score 0 Agatston units, suggesting low risk for future cardiac events. 2.  No plaque or stenosis noted in the coronary arteries. Dalton Mclean Electronically Signed   By: Marca Ancona M.D.   On: 02/27/2017 16:44   Result Date: 02/27/2017 EXAM: OVER-READ INTERPRETATION  CT CHEST The following report is an  over-read performed by radiologist Dr. Noe Gens [facility] on 02/27/2017. This over-read does not include interpretation of cardiac or coronary anatomy or pathology. The coronary CTA interpretation by the cardiologist is attached. COMPARISON:  None. FINDINGS: Cardiovascular: Prior median sternotomy. Heart is upper limits normal in size. Aorta is normal caliber. Mediastinum/Nodes: No adenopathy in the lower mediastinum or hila. Lungs/Pleura: Trace left pleural effusion. Dependent bibasilar atelectasis. Upper Abdomen: Imaging into the upper abdomen shows no acute findings. Musculoskeletal: Chest wall soft tissues are unremarkable. No acute bony abnormality. IMPRESSION: Trace left pleural effusion.  Bibasilar atelectasis. Electronically Signed: By: Charlett Nose M.D. On: 02/27/2017 13:07    Cardiac Studies   Echocardiogram: 02/26/2017 Study Conclusions  - Left ventricle: The cavity size was normal. Wall thickness was   normal. Systolic function was normal. The estimated ejection   fraction was in the range of 60% to 65%. Wall motion was normal;   there were no regional wall motion abnormalities. The study is   not technically sufficient to allow evaluation of LV diastolic   function. - Aortic valve: There was trivial regurgitation. - Mitral valve: A mechanical prosthesis was present. - Tricuspid valve: Prior procedures included surgical repair.  Impressions:  - Normal LV systolic function; trace AI; s/p MVR with normal mean   gradient of 5 mmHg and trace MR; s/p TV repair with trace TR.  Patient Profile     31 y.o. 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   Assessment & Plan    1. Mechanical Mitral Valve - he is s/p MVR with 29 mm St. Jude mechanical valve and tricuspid  valve annuloplasty in 07/2016 performed in the setting of congenital mitral valve disease.  - echo in 09/2016 showed a preserved EF of 55-60% with normal function of the mechanical prosthesis. Repeat echo yesterday showed a preserved EF of 60-65% with no regional WMA and s/p MVR with mean gradient of 5 mm Hg.   - INR was subtherapeutic 1.25 on admission. Reports missing a few doses of Coumadin but doubling the dose to account for this. Being bridged with Heparin to achieve therapeutic INR. At 2.43 this AM (goal 2.5 - 3.5). Appreciate Pharmacy's assistance.  -INR pending today, but was therapeutic yesterday - if it remains therapeutic, can be discharged home today.   2. NSTEMI/ Atypical Chest Pain -  cyclic troponin values have been at 0.80, 1.10, and 0.94. Cath was performed in 09/2016 and the LM could not be selectively engaged, however subselective imaging demonstrated a widely patent left main, proximal to distal LAD, proximal to mid circumflex, and normal RCA.  - will order Coronary CT to evaluate for congenital abnormalities as recommended by Dr. Rennis Golden yesterday.  - Started Gabapentin at 300mg  BID yesterday - would Rx for him to use at home - he feels this is helping him.   3. HTN  - BP is well-controlled at 106/50 - 120/63. - continue Lopressor. Only taking once daily PTA.   Ok for d/c home today. Needs coumadin clinic follow-up at [facility] early next week. Then follow-up with me in 3-4 weeks.  Chrystie Nose, MD, [facility]  [place]  [facility]  Medical Director of the [facility]  Cardiovascular Risk Reduction Clinic Attending Cardiologist  Direct Dial: [phone]  Fax: [phone]  Website:  [email or web address]

## 2017-02-28 NOTE — Progress Notes (Signed)
ANTICOAGULATION CONSULT NOTE - Follow Up Consult  Pharmacy Consult for Coumadin / heparin  Indication: mech MVR  No Known Allergies  Patient Measurements: Height: 5\' 7"  (170.2 cm) Weight: 139 lb 12.8 oz (63.4 kg) IBW/kg (Calculated) : 66.1  Vital Signs: Temp: 97.8 F (36.6 C) (10/19 0504) Temp Source: Oral (10/19 0504) BP: 119/69 (10/19 0953) Pulse Rate: 88 (10/19 0954)  Labs:  Recent Labs  02/25/17 1330  02/26/17 0000 02/26/17 0532  02/26/17 1213 02/26/17 1519 02/27/17 0254 02/28/17 0726  HGB 13.0  --   --  12.0*  --   --   --  12.6* 12.2*  HCT 38.7*  --   --  36.9*  --   --   --  36.4* 36.6*  PLT 260  --   --  248  --   --   --  236 263  LABPROT 15.6*  --   --  17.8*  --   --   --  26.3* 26.9*  INR 1.25  --   --  1.48  --   --   --  2.43 2.51  HEPARINUNFRC  --   --   --   --   < >  --  0.37 0.33 0.22*  CREATININE 0.89  --   --  0.96  --   --   --   --   --   TROPONINI  --   < > 1.10* 0.94*  --  0.59*  --   --   --   < > = values in this interval not displayed.  Estimated Creatinine Clearance: 99.1 mL/min (by C-G formula based on SCr of 0.96 mg/dL).   Assessment:  Anticoag: Coumadin PTA for MVR. Bridge with heparin until INR therapeutic. INR 1.25>1.48>2.43> 2.5 - stop heaprin today HL 0.22 on drip 750uts/hr.  CBC WNL. HL 0.33 in goal range.  - PTA Coumadin 5mg  daily with admit INR 1.25 -- but missed doses pta - PA note 10/18 says pt had many questions about Coumadin. Reports missing a few doses of Coumadin but doubling the dose to account for this  Goal of Therapy:  Heparin level 0.3-0.7 units/ml  INR 2.5-3.5 Monitor platelets by anticoagulation protocol: Yes   Plan:  DC home on PTA warfarin dose 5mg  daily F/u with CVRR as outpatient   Leota SauersLisa Zetta Stoneman Pharm.D. CPP, BCPS Clinical Pharmacist [phone] 02/28/2017 1:18 PM

## 2017-02-28 NOTE — Progress Notes (Signed)
Patient for discharge home with no apparent distress noted. Medications and discharge instructions explained to the patient. He verbalized understanding. Copies given to him. IV saline lock and tele pack removed. CCMD aware.

## 2017-02-28 NOTE — Plan of Care (Signed)
Problem: Physical Regulation: Goal: Ability to maintain clinical measurements within normal limits will improve Outcome: Progressing Pt c/o minimal CP 3/10. Medications administered. VSS. Pt up independent w/ no new complaints. Heparin running as ordered. Will continue to monitor.

## 2017-02-28 NOTE — Telephone Encounter (Signed)
Per PA hospital note, voltaren ineffective. Patient started on gabapentin in hospital.

## 2017-03-03 ENCOUNTER — Ambulatory Visit (INDEPENDENT_AMBULATORY_CARE_PROVIDER_SITE_OTHER): Payer: Medicaid Other | Admitting: Pharmacist

## 2017-03-03 DIAGNOSIS — Z7901 Long term (current) use of anticoagulants: Secondary | ICD-10-CM

## 2017-03-03 DIAGNOSIS — Z952 Presence of prosthetic heart valve: Secondary | ICD-10-CM

## 2017-03-03 DIAGNOSIS — Z9889 Other specified postprocedural states: Secondary | ICD-10-CM

## 2017-03-03 LAB — POCT INR: INR: 2.9

## 2017-03-06 ENCOUNTER — Telehealth: Payer: Self-pay | Admitting: Internal Medicine

## 2017-03-06 NOTE — Telephone Encounter (Signed)
Received fax request for prior authorization for carafate 1gm/8210ml Qty: 420ml, instructions: take 10ml by mouth QID with meals & at bedtime. This med was Rx'ed upon hospital discharge. Reviewed chart and could not locate obvious reason medication was started. Will need input from PA who completed discharge & Rx'ed med Theodore Demark(Rhonda Barrett, PA) and Dr. Rennis GoldenHilty, MD

## 2017-03-07 NOTE — Telephone Encounter (Signed)
Sucralfate was started while the pt was in the hospital. I continued it on d/c. Unclear if it was helping his sx. Ok to hold it and f/u as scheduled. Thanks

## 2017-03-11 NOTE — Telephone Encounter (Signed)
LMTCB to discuss Rx

## 2017-03-31 ENCOUNTER — Ambulatory Visit (INDEPENDENT_AMBULATORY_CARE_PROVIDER_SITE_OTHER): Payer: Medicaid Other | Admitting: Internal Medicine

## 2017-03-31 ENCOUNTER — Ambulatory Visit (INDEPENDENT_AMBULATORY_CARE_PROVIDER_SITE_OTHER): Payer: Medicaid Other | Admitting: Pharmacist Clinician (PhC)/ Clinical Pharmacy Specialist

## 2017-03-31 ENCOUNTER — Encounter (HOSPITAL_COMMUNITY): Payer: Self-pay | Admitting: Family Medicine

## 2017-03-31 ENCOUNTER — Emergency Department (HOSPITAL_COMMUNITY)
Admission: EM | Admit: 2017-03-31 | Discharge: 2017-03-31 | Disposition: A | Discharge status: Home / Self Care | Payer: Medicaid Other | Attending: Emergency Medicine | Admitting: Emergency Medicine

## 2017-03-31 VITALS — BP 152/93 | HR 108 | Ht 67.0 in | Wt 144.6 lb

## 2017-03-31 DIAGNOSIS — I10 Essential (primary) hypertension: Secondary | ICD-10-CM | POA: Insufficient documentation

## 2017-03-31 DIAGNOSIS — M792 Neuralgia and neuritis, unspecified: Secondary | ICD-10-CM

## 2017-03-31 DIAGNOSIS — Z79899 Other long term (current) drug therapy: Secondary | ICD-10-CM | POA: Diagnosis not present

## 2017-03-31 DIAGNOSIS — Z7901 Long term (current) use of anticoagulants: Secondary | ICD-10-CM | POA: Diagnosis not present

## 2017-03-31 DIAGNOSIS — R0789 Other chest pain: Secondary | ICD-10-CM | POA: Diagnosis not present

## 2017-03-31 DIAGNOSIS — Z9889 Other specified postprocedural states: Secondary | ICD-10-CM

## 2017-03-31 DIAGNOSIS — Z952 Presence of prosthetic heart valve: Secondary | ICD-10-CM

## 2017-03-31 DIAGNOSIS — J029 Acute pharyngitis, unspecified: Secondary | ICD-10-CM | POA: Insufficient documentation

## 2017-03-31 LAB — RAPID STREP SCREEN (MED CTR MEBANE ONLY): Streptococcus, Group A Screen (Direct): NEGATIVE

## 2017-03-31 LAB — POCT INR: INR: 2.5

## 2017-03-31 MED ORDER — CHLORHEXIDINE GLUCONATE 0.12 % MT SOLN: 15.0000 mL | Freq: Two times a day (BID) | OROMUCOSAL | 0 refills | Status: DC

## 2017-03-31 MED ORDER — PROMETHAZINE-DM 6.25-15 MG/5ML PO SYRP: 5.0000 mL | ORAL_SOLUTION | Freq: Four times a day (QID) | ORAL | 0 refills | Status: DC | PRN

## 2017-03-31 NOTE — ED Triage Notes (Signed)
Patient is complaining of a sore throat that started 3 weeks ago. Patient has used Halls cough drops, DayQuil, and drunk tea with mild relief. Denies fever.

## 2017-03-31 NOTE — Patient Instructions (Addendum)
You have been referred to Pain Specialist Thyra BreedMark Phillips, MD  [email or web address]  Location [address], [address]  [place]  [phone]  Our Hours Monday - Friday: 8 a.m. to 8 p.m. Saturday - Sunday: 10 a.m. to 4 p.m.  OPEN 365 DAYS A YEAR  Your physician wants you to follow-up in: 6 months with Dr. Rennis GoldenHilty. You will receive a reminder letter in the mail two months in advance. If you don't receive a letter, please call our office to schedule the follow-up appointment.

## 2017-03-31 NOTE — ED Provider Notes (Signed)
[facility]-EMERGENCY DEPT Provider Note   CSN: [id number] Arrival date & time: 03/31/17  1632     History   Chief Complaint Chief Complaint  Patient presents with  . Sore Throat    HPI Robert Mcmahon is a 31 y.o. male.  HPI   31 year old male with significant history of cardiac disease presenting for evaluation of sore throat.  The patient reports for the past 3 weeks he has had throat discomfort.  States that it is a sore to swallow with associated nasal congestion, occasional sneezing, and coughing.  Symptom has been waxing and waning but for the past few days it has increased in severity worsened with eating.  He denies having fever, chills, severe headache, chest pain, trouble breathing, shortness of breath, significant neck pain or rash.  He has been taking Hall's, and DayQuil at home and drinking tea with some relief.  Earlier did report having some chest discomfort but he follow-up with his cardiologist yesterday, Dr. Rennis GoldenHilty, and was referred to a pain specialist.  Past Medical History:  Diagnosis Date  . Chest pain 09/19/2016  . Depression   . Hypertension     Patient Active Problem List   Diagnosis Date Noted  . Congenital heart disease   . Precordial chest pain   . NSTEMI (non-ST elevated myocardial infarction) (HCC) 02/25/2017  . Subtherapeutic international normalized ratio (INR) 02/25/2017  . H/O mitral valve replacement 09/19/2016  . Dyspnea 09/19/2016  . 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 Elevated troponin   . Chest wall pain 09/18/2016    Past Surgical History:  Procedure Laterality Date  . CARDIAC SURGERY  07/29/2016   mitral & tricuspid valve replacement  . Left Heart Cath and Coronary Angiography N/A 09/20/2016   Performed by Lyn RecordsSmith, Henry W, MD at [facility]       Home Medications    Prior to Admission medications   Medication Sig Start Date End Date Taking? Authorizing Provider  acetaminophen (TYLENOL) 325  MG tablet Take 975 mg by mouth every 6 (six) hours as needed for moderate pain.    [provider]  colchicine 0.6 MG tablet Take 1 tablet (0.6 mg total) by mouth 2 (two) times daily as needed (gout attack). 02/28/17   Barrett, Joline Salthonda G, PA-C  docusate sodium (COLACE) 100 MG capsule Take 100 mg by mouth daily as needed for mild constipation.     [provider]  gabapentin (NEURONTIN) 600 MG tablet Take 0.5 tablets (300 mg total) by mouth 2 (two) times daily. 02/28/17   Barrett, Joline Salthonda G, PA-C  ketorolac (TORADOL) 10 MG tablet Take 10 mg by mouth every 6 (six) hours as needed for severe pain.    [provider]  metoprolol tartrate (LOPRESSOR) 25 MG tablet Take 25 mg by mouth every morning.  02/05/17   Hilty, Lisette AbuKenneth C, MD  nitroGLYCERIN (NITROSTAT) 0.4 MG SL tablet Place 1 tablet (0.4 mg total) under the tongue every 5 (five) minutes x 3 doses as needed for chest pain. 02/28/17   Barrett, Joline Salthonda G, PA-C  pantoprazole (PROTONIX) 20 MG tablet Take 1 tablet (20 mg total) by mouth daily. 02/28/17 03/30/17  Barrett, Joline Salthonda G, PA-C  sucralfate (CARAFATE) 1 GM/10ML suspension Take 10 mLs (1 g total) by mouth 4 (four) times daily -  with meals and at bedtime. 02/28/17   Barrett, Joline Salthonda G, PA-C  warfarin (COUMADIN) 5 MG tablet Take 1 to 1.5 tablets by mouth daily as directed Patient taking  differently: Take 5 mg by mouth daily.  12/16/16   Chrystie NoseHilty, Kenneth C, MD    Family History Family History  Problem Relation Age of Onset  . Heart murmur Mother     Social History Social History   Tobacco Use  . Smoking status: Never Smoker  . Smokeless tobacco: Never Used  Substance Use Topics  . Alcohol use: No  . Drug use: No     Allergies   Patient has no known allergies.   Review of Systems Review of Systems  Constitutional: Negative for fever.  HENT: Positive for sore throat.   All other systems reviewed and are negative.    Physical Exam Updated Vital Signs BP (!)  150/93   Pulse 95   Temp 98.3 F (36.8 C) (Oral)   Resp 16   Ht 5\' 7"  (1.702 m)   Wt 65.3 kg (144 lb)   SpO2 99%   BMI 22.55 kg/m   Physical Exam  Constitutional: He appears well-developed and well-nourished. No distress.  Patient is sitting upright in no acute discomfort, nontoxic in appearance  HENT:  Head: Atraumatic.  Right Ear: Tympanic membrane normal.  Left Ear: Tympanic membrane normal.  Mouth/Throat: No uvula swelling. No posterior oropharyngeal edema, posterior oropharyngeal erythema or tonsillar abscesses.  Eyes: Conjunctivae are normal.  Neck: Neck supple.  Cardiovascular: Normal rate and regular rhythm.  Murmur heard. Pulmonary/Chest: Effort normal and breath sounds normal. He has no wheezes. He has no rhonchi. He has no rales.  Abdominal: Soft. There is no tenderness.  Neurological: He is alert.  Skin: No rash noted.  Psychiatric: He has a normal mood and affect.  Nursing note and vitals reviewed.    ED Treatments / Results  Labs (all labs ordered are listed, but only abnormal results are displayed) Labs Reviewed  RAPID STREP SCREEN (NOT AT [facility])  CULTURE, GROUP A STREP [facility])    EKG  EKG Interpretation None       Radiology No results found.  Procedures Procedures (including critical care time)  Medications Ordered in ED Medications - No data to display   Initial Impression / Assessment and Plan / ED Course  I have reviewed the triage vital signs and the nursing notes.  Pertinent labs & imaging results that were available during my care of the patient were reviewed by me and considered in my medical decision making (see chart for details).     BP (!) 150/93   Pulse 95   Temp 98.3 F (36.8 C) (Oral)   Resp 16   Ht 5\' 7"  (1.702 m)   Wt 65.3 kg (144 lb)   SpO2 99%   BMI 22.55 kg/m    Final Clinical Impressions(s) / ED Diagnoses   Final diagnoses:  Viral pharyngitis    ED Discharge Orders        Ordered    chlorhexidine  (PERIDEX) 0.12 % solution  2 times daily     03/31/17 1815    promethazine-dextromethorphan (PROMETHAZINE-DM) 6.25-15 MG/5ML syrup  4 times daily PRN     03/31/17 1815     5:53 PM Patient complaining of sore throat along with other viral related symptoms.  He is well-appearing, no trismus, no stridor, able to swallow his spit, in no acute discomfort.  Will obtain rapid strep test to rule out bacterial infection.  6:16 PM Strep negative, will provide sxs treatment for his viral illness.     Fayrene Helperran, Thia Olesen, PA-C 03/31/17 1817    Linwood DibblesKnapp, Jon, MD  04/01/17 0028  

## 2017-04-01 ENCOUNTER — Encounter: Payer: Self-pay | Admitting: Internal Medicine

## 2017-04-01 DIAGNOSIS — J029 Acute pharyngitis, unspecified: Secondary | ICD-10-CM | POA: Insufficient documentation

## 2017-04-01 DIAGNOSIS — M792 Neuralgia and neuritis, unspecified: Secondary | ICD-10-CM | POA: Insufficient documentation

## 2017-04-01 DIAGNOSIS — Z7901 Long term (current) use of anticoagulants: Secondary | ICD-10-CM | POA: Insufficient documentation

## 2017-04-01 NOTE — Progress Notes (Signed)
OFFICE FOLLOW-UP NOTE  Chief Complaint:  Follow-up hospitalization  Primary Care Physician: Patient, No Pcp Per  HPI:  Robert Mcmahon is a 31 y.o. 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 Jude Masters series mechanical mitral valve and concomitant tricuspid valve annuloplasty with a 28 mm Medtronic contour 3-D ring in [place]. He was transitioning to living in [place]. He presented to the hospital in May with chest pain and shortness of breath. He was found to have an elevated troponin which rose and fell consistent more with an ACS pattern. His INR was subtherapeutic on admission and I was concerned about possibly a coronary thrombus. Although he reported having had heart catheterization prior to valve surgery, we could only find records of his right heart catheterization. My experience is not unusual for young patient did not have left heart catheterization prior to valve surgery given the low likelihood of coronary disease. Based on these findings I thought it prudent for him to undergo left heart catheterization. He did have left heart catheterization with Dr. Katrinka Mcmahon on 09/20/2016. This was noted to be difficult procedure from the radial approach. He was not found to have any obstructive coronary disease. He was transitioned back to warfarin and presents today for follow-up. Since discharge he said no further chest pain or worsening shortness of breath. INR most recently was therapeutic. INR today is at goal at 2.4. He says that he may stay in [place] for the summer but could transition back to [place]. He also recently saw his surgeon who is requesting records of this hospitalization.  01/01/2017  Mr. Robert Mcmahon was recently seen in the ER with complaints of chest pain. He ruled out for MI and was not felt to be cardiac. He says he's been under less stress and is trying to get  SSDI. His INR was slightly subtherapeutic. He is due for recheck and will probably check that today. It started some vitamins, possibly containing vitamin K which may be interfered with his INRs. He had planted going back to [place] but is currently staying in [place] with his family. We discussed work and he mentioned that he used to work for [employer], but had passed out which led to finding of his valvular heart disease. Recently however he's done very well after valve replacement and really has no cardiac limitations that I'm aware of that would keep him from working.  01/30/2017  Mr. Robert Mcmahon was recently seen again in the emergency department. He had some recurrent chest pain and was evaluated and found to have a normal troponin. He says his pain is been persistent now for about 2 weeks. It's located across the anterior chest and worse when taking a deep breath. His INR was therapeutic at 2.06.  04/01/2017  Mr. Robert Mcmahon returns today for follow-up of recent hospitalization.  He was discharged on October 19 after presentation with chest pain and being found to again have elevated troponin.  This is similar to his presentation earlier this year, noting that his INR again was subtherapeutic at 1.25 on admission.  It is thought this possibly could have put him at risk for coronary arterial emboli related to his mechanical mitral valve.  His chest pain was atypical and reproducible.  He was started on gabapentin but reports it is not helped.  He has doubled the dose from 300 mg twice daily to 600 mg  twice daily on his own without significant improvement.  Today he is complaining of throat pain and difficulty swallowing, but denies any fever or chills, cough or associated symptoms.  He is concerned about possible strep throat, however I informed him we did not have the capabilities to do that testing here.  He is also overdue for repeat INR testing, which we will schedule.  He should be followed  twice monthly.  Finally he is applying for disability.  He reports frequent chest pain and wishes for me to write a letter to describe how this interferes with his ability to work.  He does not have a primary care provider, and states he still having problems transferring his insurance from [place] to [place].  PMHx:  Past Medical History:  Diagnosis Date  . Chest pain 09/19/2016  . Depression   . Hypertension     Past Surgical History:  Procedure Laterality Date  . CARDIAC SURGERY  07/29/2016   mitral & tricuspid valve replacement  . LEFT HEART CATH AND CORONARY ANGIOGRAPHY N/A 09/20/2016   Procedure: Left Heart Cath and Coronary Angiography;  Surgeon: Lyn RecordsSmith, Henry W, MD;  [facility];  Service: Cardiovascular;  Laterality: N/A;    FAMHx:  Family History  Problem Relation Age of Onset  . Heart murmur Mother     SOCHx:   reports that  has never smoked. he has never used smokeless tobacco. He reports that he does not drink alcohol or use drugs.  ALLERGIES:  No Known Allergies  ROS: Pertinent items noted in HPI and remainder of comprehensive ROS otherwise negative.  HOME MEDS: Current Outpatient Medications on File Prior to Visit  Medication Sig Dispense Refill  . acetaminophen (TYLENOL) 325 MG tablet Take 975 mg by mouth every 6 (six) hours as needed for moderate pain.    Marland Kitchen. colchicine 0.6 MG tablet Take 1 tablet (0.6 mg total) by mouth 2 (two) times daily as needed (gout attack). 20 tablet 0  . docusate sodium (COLACE) 100 MG capsule Take 100 mg by mouth daily as needed for mild constipation.     . gabapentin (NEURONTIN) 600 MG tablet Take 0.5 tablets (300 mg total) by mouth 2 (two) times daily. 60 tablet 0  . ketorolac (TORADOL) 10 MG tablet Take 10 mg by mouth every 6 (six) hours as needed for severe pain.    . metoprolol tartrate (LOPRESSOR) 25 MG tablet Take 25 mg by mouth every morning.  180 tablet 3  . nitroGLYCERIN (NITROSTAT) 0.4 MG SL tablet  Place 1 tablet (0.4 mg total) under the tongue every 5 (five) minutes x 3 doses as needed for chest pain. 25 tablet 12  . sucralfate (CARAFATE) 1 GM/10ML suspension Take 10 mLs (1 g total) by mouth 4 (four) times daily -  with meals and at bedtime. 420 mL 0  . warfarin (COUMADIN) 5 MG tablet Take 1 to 1.5 tablets by mouth daily as directed (Patient taking differently: Take 5 mg by mouth daily. ) 105 tablet 0  . pantoprazole (PROTONIX) 20 MG tablet Take 1 tablet (20 mg total) by mouth daily. 30 tablet 0   No current facility-administered medications on file prior to visit.     LABS/IMAGING: No results found for this or any previous visit (from the past 48 hour(s)). No results found.  LIPID PANEL:    Component Value Date/Time   CHOL 181 02/26/2017 0532   TRIG 71 02/26/2017 0532   HDL 37 (L) 02/26/2017 0532  CHOLHDL 4.9 02/26/2017 0532   VLDL 14 02/26/2017 0532   LDLCALC 130 (H) 02/26/2017 0532     WEIGHTS: Wt Readings from Last 3 Encounters:  03/31/17 144 lb (65.3 kg)  03/31/17 144 lb 9.6 oz (65.6 kg)  02/28/17 139 lb 12.8 oz (63.4 kg)    VITALS: BP (!) 152/93   Pulse (!) 108   Ht 5\' 7"  (1.702 m)   Wt 144 lb 9.6 oz (65.6 kg)   SpO2 98%   BMI 22.65 kg/m   EXAM: General appearance: alert, no distress and Thin Neck: no carotid bruit and no JVD Lungs: clear to auscultation bilaterally Heart: regular rate and rhythm, S1, S2 normal, ejection click present and There is pain in palpation over the anterior chest which is worse with deep breathing Abdomen: soft, non-tender; bowel sounds normal; no masses,  no organomegaly Extremities: extremities normal, atraumatic, no cyanosis or edema Pulses: 2+ and symmetric Skin: Skin color, texture, turgor normal. No rashes or lesions Neurologic: Grossly normal Psych: Pleasant  EKG: Deferred  ASSESSMENT: 1. Chest wall pain - ?neuropathic pain, pleurisy, costochondritis 2. Elevated troponin-possibly related to transient coronary  thrombus 3. 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 Jude Master series mitral valve replacement 4. History of tricuspid regurgitation status post 28 mm Medtronic 3-D contour series tricuspid annuloplasty ring 5. Long-term anticoagulation on warfarin  6. Probable viral pharyngitis  PLAN: 1.   Mr. Cacciola has had recurrent chest pain.  He again presented with similar symptoms and was subtherapeutic on his INR.  Troponin was elevated consistent with non-STEMI however no clear etiology was noted.  He had recently had normal coronaries by heart catheterization.  He reports compliance with his medication.  We will need to survey his INR more closely.  I tried gabapentin with minimal improvement in his symptoms.  His pain is reproducible and may represent a chronic regional pain syndrome.  I will refer him to a pain management specialist.  In addition, he is describing some throat pain which is likely a viral pharyngitis.  He is concerned about strep throat, however we did not have the equipment to do a throat swab in the office.  I directed him to urgent care since he is not yet established a primary care provider in the past year that he has been in [place].  Follow-up in 6 months.  Chrystie Nose, MD, [facility]  [place]  [facility]  Attending Cardiologist  Direct Dial: [phone]  Fax: [phone]  Website:  [email or web address] 04/01/2017, 6:44 PM

## 2017-04-02 ENCOUNTER — Telehealth: Payer: Self-pay | Admitting: Internal Medicine

## 2017-04-02 NOTE — Telephone Encounter (Signed)
Left message that paperwork for SS disability clinical eval is complete. Asked for call back to know if patient would like this mailed to him or would like to come pick this up.

## 2017-04-02 NOTE — Telephone Encounter (Signed)
Faxed paperwork to [employer] [phone]

## 2017-04-02 NOTE — Telephone Encounter (Signed)
Follow up     Patient calling back to request paperwork be faxed to fax number on form, if not please mail.  Please call.

## 2017-04-03 LAB — CULTURE, GROUP A STREP (THRC)

## 2017-04-24 ENCOUNTER — Other Ambulatory Visit: Payer: Self-pay | Admitting: Internal Medicine

## 2017-04-24 ENCOUNTER — Telehealth: Payer: Self-pay | Admitting: Internal Medicine

## 2017-04-24 MED ORDER — WARFARIN SODIUM 5 MG PO TABS
ORAL_TABLET | ORAL | 0 refills | Status: DC
Start: 2017-04-24 — End: 2017-04-28

## 2017-04-24 NOTE — Telephone Encounter (Signed)
Rx supply for 1 week sent to prefer pharmacy.  Needs f/u INR before additional refill

## 2017-04-24 NOTE — Telephone Encounter (Signed)
New message      *STAT* If patient is at the pharmacy, call can be transferred to refill team.   1. Which medications need to be refilled? (please list name of each medication and dose if known) warafrin  2. Which pharmacy/location (including street and city if local pharmacy) is medication to be sent to? [facility] on [address]    3. Do they need a 30 day or 90 day supply?  30

## 2017-04-24 NOTE — Telephone Encounter (Signed)
New Message..  Pt would like to speak with RN about the amount of tablets he received. Pt states the last refill he received was only 15 tablets. And he would like a refill of 30 tablets, please call back to discuss

## 2017-04-25 ENCOUNTER — Other Ambulatory Visit: Payer: Self-pay | Admitting: Pharmacist Clinician (PhC)/ Clinical Pharmacy Specialist

## 2017-04-28 ENCOUNTER — Ambulatory Visit (INDEPENDENT_AMBULATORY_CARE_PROVIDER_SITE_OTHER): Payer: Medicaid Other | Admitting: Pharmacist

## 2017-04-28 DIAGNOSIS — Z7901 Long term (current) use of anticoagulants: Secondary | ICD-10-CM | POA: Diagnosis not present

## 2017-04-28 DIAGNOSIS — Z9889 Other specified postprocedural states: Secondary | ICD-10-CM | POA: Diagnosis not present

## 2017-04-28 DIAGNOSIS — Z952 Presence of prosthetic heart valve: Secondary | ICD-10-CM | POA: Diagnosis not present

## 2017-04-28 LAB — POCT INR: INR: 2.9

## 2017-04-28 MED ORDER — WARFARIN SODIUM 5 MG PO TABS: ORAL_TABLET | ORAL | 0 refills | Status: DC

## 2017-04-28 MED ORDER — METOPROLOL TARTRATE 25 MG PO TABS: 25.0000 mg | ORAL_TABLET | ORAL | 0 refills | Status: DC

## 2017-05-15 ENCOUNTER — Ambulatory Visit: Payer: Medicaid Other | Admitting: Internal Medicine

## 2017-05-21 ENCOUNTER — Ambulatory Visit (INDEPENDENT_AMBULATORY_CARE_PROVIDER_SITE_OTHER): Payer: Medicaid Other | Admitting: Internal Medicine

## 2017-05-21 ENCOUNTER — Encounter: Payer: Self-pay | Admitting: Internal Medicine

## 2017-05-21 ENCOUNTER — Telehealth: Payer: Self-pay | Admitting: Internal Medicine

## 2017-05-21 ENCOUNTER — Ambulatory Visit (INDEPENDENT_AMBULATORY_CARE_PROVIDER_SITE_OTHER): Payer: Medicaid Other | Admitting: Pharmacist Clinician (PhC)/ Clinical Pharmacy Specialist

## 2017-05-21 VITALS — BP 98/52 | HR 66 | Temp 98.2°F | Ht 67.0 in | Wt 142.0 lb

## 2017-05-21 DIAGNOSIS — Z9889 Other specified postprocedural states: Secondary | ICD-10-CM

## 2017-05-21 DIAGNOSIS — R0789 Other chest pain: Secondary | ICD-10-CM | POA: Diagnosis not present

## 2017-05-21 DIAGNOSIS — Z952 Presence of prosthetic heart valve: Secondary | ICD-10-CM

## 2017-05-21 DIAGNOSIS — Z7901 Long term (current) use of anticoagulants: Secondary | ICD-10-CM | POA: Diagnosis not present

## 2017-05-21 LAB — POCT INR: INR: 2.7

## 2017-05-21 NOTE — Patient Instructions (Signed)
Your physician wants you to follow-up in: 6 months with Dr. Hilty. You will receive a reminder letter in the mail two months in advance. If you don't receive a letter, please call our office to schedule the follow-up appointment.    

## 2017-05-21 NOTE — Telephone Encounter (Signed)
New Message  Patient states that he received a call after his appt. I am not showing any notes. Please call

## 2017-05-21 NOTE — Progress Notes (Signed)
OFFICE FOLLOW-UP NOTE  Chief Complaint:  Follow-up chest pain  Primary Care Physician: Patient, No Pcp Per  HPI:  Robert Mcmahon is a 32 y.o. 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 Jude Masters series mechanical mitral valve and concomitant tricuspid valve annuloplasty with a 28 mm Medtronic contour 3-D ring in [place]. He was transitioning to living in [place]. He presented to the hospital in May with chest pain and shortness of breath. He was found to have an elevated troponin which rose and fell consistent more with an ACS pattern. His INR was subtherapeutic on admission and I was concerned about possibly a coronary thrombus. Although he reported having had heart catheterization prior to valve surgery, we could only find records of his right heart catheterization. My experience is not unusual for young patient did not have left heart catheterization prior to valve surgery given the low likelihood of coronary disease. Based on these findings I thought it prudent for him to undergo left heart catheterization. He did have left heart catheterization with Dr. Katrinka Blazing on 09/20/2016. This was noted to be difficult procedure from the radial approach. He was not found to have any obstructive coronary disease. He was transitioned back to warfarin and presents today for follow-up. Since discharge he said no further chest pain or worsening shortness of breath. INR most recently was therapeutic. INR today is at goal at 2.4. He says that he may stay in [place] for the summer but could transition back to [place]. He also recently saw his surgeon who is requesting records of this hospitalization.  01/01/2017  Robert Mcmahon was recently seen in the ER with complaints of chest pain. He ruled out for MI and was not felt to be cardiac. He says he's been under less stress and is trying to get SSDI.  His INR was slightly subtherapeutic. He is due for recheck and will probably check that today. It started some vitamins, possibly containing vitamin K which may be interfered with his INRs. He had planted going back to [place] but is currently staying in [place] with his family. We discussed work and he mentioned that he used to work for [employer], but had passed out which led to finding of his valvular heart disease. Recently however he's done very well after valve replacement and really has no cardiac limitations that I'm aware of that would keep him from working.  01/30/2017  Robert Mcmahon was recently seen again in the emergency department. He had some recurrent chest pain and was evaluated and found to have a normal troponin. He says his pain is been persistent now for about 2 weeks. It's located across the anterior chest and worse when taking a deep breath. His INR was therapeutic at 2.06.  04/01/2017  Robert Mcmahon returns today for follow-up of recent hospitalization.  He was discharged on October 19 after presentation with chest pain and being found to again have elevated troponin.  This is similar to his presentation earlier this year, noting that his INR again was subtherapeutic at 1.25 on admission.  It is thought this possibly could have put him at risk for coronary arterial emboli related to his mechanical mitral valve.  His chest pain was atypical and reproducible.  He was started on gabapentin but reports it is not helped.  He has doubled the dose from 300 mg twice daily to 600  mg twice daily on his own without significant improvement.  Today he is complaining of throat pain and difficulty swallowing, but denies any fever or chills, cough or associated symptoms.  He is concerned about possible strep throat, however I informed him we did not have the capabilities to do that testing here.  He is also overdue for repeat INR testing, which we will schedule.  He should be followed twice  monthly.  Finally he is applying for disability.  He reports frequent chest pain and wishes for me to write a letter to describe how this interferes with his ability to work.  He does not have a primary care provider, and states he still having problems transferring his insurance from [place] to [place].  05/21/2017  Robert Mcmahon returns today for follow-up.  He denies any current chest pain.  Recently he was having some more episodes of chest pain however his grandmother was ill with cancer.  Unfortunately she died over the holidays.  This been very difficult for him.  Today he has had about 24 hours of some nausea, vomiting and diarrhea and is noted to be congested today.  He said it is more likely his allergic symptoms.  He thought he had a fever last night.  Some members of his family had similar symptoms this past week.  He denies any body aches and temperature in the office today was 98.2.  He is overdue for repeat INR check.  He did not qualify for disability.  He is not currently working.  He is going to try to see if he can find employment this year.  PMHx:  Past Medical History:  Diagnosis Date  . Chest pain 09/19/2016  . Depression   . Hypertension     Past Surgical History:  Procedure Laterality Date  . CARDIAC SURGERY  07/29/2016   mitral & tricuspid valve replacement  . LEFT HEART CATH AND CORONARY ANGIOGRAPHY N/A 09/20/2016   Procedure: Left Heart Cath and Coronary Angiography;  Surgeon: Lyn RecordsSmith, Henry W, MD;  [facility];  Service: Cardiovascular;  Laterality: N/A;    FAMHx:  Family History  Problem Relation Age of Onset  . Heart murmur Mother     SOCHx:   reports that  has never smoked. he has never used smokeless tobacco. He reports that he does not drink alcohol or use drugs.  ALLERGIES:  No Known Allergies  ROS: Pertinent items noted in HPI and remainder of comprehensive ROS otherwise negative.  HOME MEDS: Current Outpatient Medications on  File Prior to Visit  Medication Sig Dispense Refill  . acetaminophen (TYLENOL) 325 MG tablet Take 975 mg by mouth every 6 (six) hours as needed for moderate pain.    . chlorhexidine (PERIDEX) 0.12 % solution Use as directed 15 mLs 2 (two) times daily in the mouth or throat. 120 mL 0  . colchicine 0.6 MG tablet Take 1 tablet (0.6 mg total) by mouth 2 (two) times daily as needed (gout attack). 20 tablet 0  . docusate sodium (COLACE) 100 MG capsule Take 100 mg by mouth daily as needed for mild constipation.     . gabapentin (NEURONTIN) 600 MG tablet Take 0.5 tablets (300 mg total) by mouth 2 (two) times daily. 60 tablet 0  . ketorolac (TORADOL) 10 MG tablet Take 10 mg by mouth every 6 (six) hours as needed for severe pain.    . metoprolol tartrate (LOPRESSOR) 25 MG tablet Take 1 tablet (25 mg total) by mouth  every morning. 30 tablet 0  . nitroGLYCERIN (NITROSTAT) 0.4 MG SL tablet Place 1 tablet (0.4 mg total) under the tongue every 5 (five) minutes x 3 doses as needed for chest pain. 25 tablet 12  . promethazine-dextromethorphan (PROMETHAZINE-DM) 6.25-15 MG/5ML syrup Take 5 mLs 4 (four) times daily as needed by mouth for cough. 150 mL 0  . sucralfate (CARAFATE) 1 GM/10ML suspension Take 10 mLs (1 g total) by mouth 4 (four) times daily -  with meals and at bedtime. 420 mL 0  . warfarin (COUMADIN) 5 MG tablet Take 1 tablet by mouth daily as directed. INR check needed prior to additional refill 30 tablet 0  . pantoprazole (PROTONIX) 20 MG tablet Take 1 tablet (20 mg total) by mouth daily. 30 tablet 0   No current facility-administered medications on file prior to visit.     LABS/IMAGING: No results found for this or any previous visit (from the past 48 hour(s)). No results found.  LIPID PANEL:    Component Value Date/Time   CHOL 181 02/26/2017 0532   TRIG 71 02/26/2017 0532   HDL 37 (L) 02/26/2017 0532   CHOLHDL 4.9 02/26/2017 0532   VLDL 14 02/26/2017 0532   LDLCALC 130 (H) 02/26/2017 0532       WEIGHTS: Wt Readings from Last 3 Encounters:  05/21/17 142 lb (64.4 kg)  03/31/17 144 lb (65.3 kg)  03/31/17 144 lb 9.6 oz (65.6 kg)    VITALS: BP (!) 98/52   Pulse 66   Temp 98.2 F (36.8 C) (Oral)   Ht 5\' 7"  (1.702 m)   Wt 142 lb (64.4 kg)   BMI 22.24 kg/m   EXAM: General appearance: alert, no distress and Thin Neck: no carotid bruit and no JVD Lungs: clear to auscultation bilaterally Heart: regular rate and rhythm, S1, S2 normal, ejection click present and There is pain in palpation over the anterior chest which is worse with deep breathing Abdomen: soft, non-tender; bowel sounds normal; no masses,  no organomegaly Extremities: extremities normal, atraumatic, no cyanosis or edema Pulses: 2+ and symmetric Skin: Skin color, texture, turgor normal. No rashes or lesions Neurologic: Grossly normal Psych: Pleasant  EKG: Normal sinus rhythm, left anterior fascicular block, inferior and lateral T wave inversions at 66-personally reviewed  ASSESSMENT: 1. Chest wall pain - ?neuropathic pain, pleurisy, costochondritis 2. Elevated troponin-possibly related to transient coronary thrombus 3. 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 Jude Master series mitral valve replacement 4. History of tricuspid regurgitation status post 28 mm Medtronic 3-D contour series tricuspid annuloplasty ring 5. Long-term anticoagulation on warfarin   PLAN: 1.   Robert Mcmahon has had recent chest pain however it seems to have improved somewhat after his grandmother died.  I think stress is a big component here.  He reports daily aches which she feels like it may be difficult for him to work however if he is more active that may improve as well.  We will check INR today in the office.  He is having some viral symptoms with recent GI complaints and some congestion.  He should stay well-hydrated and likely this will be self-limited.    Follow-up in 6 months.  Chrystie Nose, MD, [facility], FACP   [place]  [facility]  Medical Director of the Advanced Lipid Disorders &  Cardiovascular Risk Reduction Clinic Diplomate of the American Board of Clinical Lipidology Attending Cardiologist  Direct Dial: [phone]  Fax: [phone]  Website:  [email or web address]  Lisette Abu  Amarissa Koerner 05/21/2017, 8:25 AM

## 2017-05-21 NOTE — Telephone Encounter (Signed)
Returned call to patient. Explained there is no documentation where he was called by our office staff. He states it was in reference to his 05/26/17 INR check being cancelled since his INR was checked today at his OV. No further action needed.

## 2017-06-03 ENCOUNTER — Other Ambulatory Visit: Payer: Self-pay | Admitting: Internal Medicine

## 2017-06-18 ENCOUNTER — Ambulatory Visit (INDEPENDENT_AMBULATORY_CARE_PROVIDER_SITE_OTHER): Payer: Medicaid Other | Admitting: Pharmacist

## 2017-06-18 DIAGNOSIS — Z7901 Long term (current) use of anticoagulants: Secondary | ICD-10-CM | POA: Diagnosis not present

## 2017-06-18 DIAGNOSIS — Z952 Presence of prosthetic heart valve: Secondary | ICD-10-CM

## 2017-06-18 DIAGNOSIS — Z9889 Other specified postprocedural states: Secondary | ICD-10-CM | POA: Diagnosis not present

## 2017-06-18 LAB — POCT INR: INR: 2.8

## 2017-07-03 ENCOUNTER — Telehealth: Payer: Self-pay | Admitting: Internal Medicine

## 2017-07-03 MED ORDER — WARFARIN SODIUM 5 MG PO TABS: ORAL_TABLET | ORAL | 1 refills | Status: DC

## 2017-07-03 NOTE — Telephone Encounter (Signed)
°*  STAT* If patient is at the pharmacy, call can be transferred to refill team.   1. Which medications need to be refilled? (please list name of each medication and dose if known) Warfarin and Cetirizine  2. Which pharmacy/location (including street and city if local pharmacy) is medication to be sent to?[facility] [place]-ph#[phone]  3. Do they need a 30 day or 90 day supply? 90

## 2017-07-07 ENCOUNTER — Telehealth: Payer: Self-pay | Admitting: Internal Medicine

## 2017-07-07 NOTE — Telephone Encounter (Signed)
New message  Pt verbalized that he is calling for the RN  Because one of his medications did not get sent to the pharmacy (Cetirizine 10mg ) please call and advise I didn't see it in his snapshot    *STAT* If patient is at the pharmacy, call can be transferred to refill team.   1. Which medications need to be refilled? (please list name of each medication and dose if known) gabapentin (NEURONTIN) 600 MG tablet   metoprolol tartrate (LOPRESSOR) 25 MG tablet   ketorolac (TORADOL) 10 MG tablet 2. Which pharmacy/location (including street and city if local pharmacy) is medication to be sent to?  [facility] [place] - [address] [phone] (Phone)    3. Do they need a 30 day or 90 day supply? 90

## 2017-07-07 NOTE — Telephone Encounter (Signed)
Yes .. Ok to fill them.   Dr. HRexene Edison

## 2017-07-09 ENCOUNTER — Telehealth: Payer: Self-pay | Admitting: Internal Medicine

## 2017-07-09 MED ORDER — KETOROLAC TROMETHAMINE 10 MG PO TABS: 10.0000 mg | ORAL_TABLET | Freq: Four times a day (QID) | ORAL | 0 refills | Status: DC | PRN

## 2017-07-09 MED ORDER — METOPROLOL TARTRATE 25 MG PO TABS: 25.0000 mg | ORAL_TABLET | ORAL | 3 refills | Status: DC

## 2017-07-09 MED ORDER — GABAPENTIN 600 MG PO TABS: 300.0000 mg | ORAL_TABLET | Freq: Two times a day (BID) | ORAL | 1 refills | Status: DC

## 2017-07-09 NOTE — Addendum Note (Signed)
Addended by: Alyson InglesBROOME, MICHELLE L on: 07/09/2017 07:57 AM   Modules accepted: Orders

## 2017-07-09 NOTE — Telephone Encounter (Signed)
Spoke with pharmacist at [facility] who is calling about refill for ketorolac. She states recommended duration for treatment is 5 days so he would only need a quantity of 20 tablets. OK'ed change in quantity from #60 to #20.

## 2017-07-14 ENCOUNTER — Telehealth: Payer: Self-pay | Admitting: Internal Medicine

## 2017-07-14 NOTE — Telephone Encounter (Signed)
Spoke with pt, for the last week the patient is having pain in the middle of his back. It is there all the time and gets worse with movement or over exertion. occ he will also have a discomfort in his chest. He is aware this is related to muscle strain but because he has had a valve replacement he was told he needed to follow up with dr Rennis Goldenhilty. Follow up scheduled with APP.
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
## 2017-07-16 ENCOUNTER — Encounter: Payer: Self-pay | Admitting: Adult Health

## 2017-07-16 ENCOUNTER — Ambulatory Visit (INDEPENDENT_AMBULATORY_CARE_PROVIDER_SITE_OTHER): Payer: Medicaid Other | Admitting: Adult Health

## 2017-07-16 ENCOUNTER — Ambulatory Visit (INDEPENDENT_AMBULATORY_CARE_PROVIDER_SITE_OTHER): Payer: Medicaid Other | Admitting: Pharmacist

## 2017-07-16 VITALS — BP 120/84 | HR 62 | Ht 67.0 in | Wt 144.4 lb

## 2017-07-16 DIAGNOSIS — R072 Precordial pain: Secondary | ICD-10-CM | POA: Diagnosis not present

## 2017-07-16 DIAGNOSIS — Z9889 Other specified postprocedural states: Secondary | ICD-10-CM | POA: Diagnosis not present

## 2017-07-16 DIAGNOSIS — Z952 Presence of prosthetic heart valve: Secondary | ICD-10-CM

## 2017-07-16 DIAGNOSIS — I214 Non-ST elevation (NSTEMI) myocardial infarction: Secondary | ICD-10-CM

## 2017-07-16 LAB — POCT INR: INR: 1.7

## 2017-07-16 MED ORDER — CETIRIZINE HCL 10 MG PO TABS: 10.0000 mg | ORAL_TABLET | Freq: Every day | ORAL | 0 refills | Status: DC
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
## 2017-07-16 NOTE — Patient Instructions (Signed)
Medication Instructions:  NO CHANGES- Your physician recommends that you continue on your current medications as directed. Please refer to the Current Medication list given to you today.  If you need a refill on your cardiac medications before your next appointment, please call your pharmacy.  Special Instructions: Go to [email or web address] -or- call [phone] for primary care provider  Follow-Up: Your physician wants you to follow-up in: July WITH DR HILTY You should receive a reminder letter in the mail two months in advance. If you do not receive a letter, please call our office 09-2017 to schedule the 11-2017 follow-up appointment.   Thank you for choosing [facility]!!

## 2017-07-30 ENCOUNTER — Telehealth: Payer: Self-pay | Admitting: Cardiology

## 2017-07-30 NOTE — Telephone Encounter (Signed)
Called Dr. Thyra BreedMark Phillips and LVM to schedule patient for a visit regarding neuropathic chest pain.

## 2017-08-07 ENCOUNTER — Other Ambulatory Visit: Payer: Self-pay | Admitting: Internal Medicine

## 2017-08-25 ENCOUNTER — Telehealth: Payer: Self-pay | Admitting: Internal Medicine

## 2017-08-25 NOTE — Telephone Encounter (Signed)
Returned call to patient who states he has been having CP constant for over a week.   State it is the "top" of his chest.  Pain is reproducible when he presses on his chest.  Denies SOB, dizziness, N/V, diaphoresis.   States the medication "is not working".   Patient states he tried tylenol as well and this did not help.  Patient states he went to the hospital before for this and the medication given in the hospital helped but what he was sent home with did not help.  Patient states when his grandmother passed away he took some of her medications and they helped take the pain away.       Per chart review: last OV 3/6 with Harriet PhoK. Lawrence DNP, was given web site and number to assist finding PCP, patient states he has not done this yet.    Hx: MVR, chronic noncardiac chest pain.  Has been referred to Dr. Thyra BreedMark Phillips for neuropathic chest pain.    Encouraged patient that if pain is reproducible with touch, it is probably musculoskeletal pain.   Encouraged patient to call [facility] to assist with finding PCP and managing pain medications as we do not typically prescribe them here.   As discussing with patient, call dropped and ended.     Routed to K. Lyman BishopLawrence DNP for further review/recommendations.

## 2017-08-25 NOTE — Telephone Encounter (Signed)
Thank you,

## 2017-08-25 NOTE — Telephone Encounter (Signed)
New message    Pt c/o of Chest Pain: STAT if CP now or developed within 24 hours  1. Are you having CP right now? NO  2. Are you experiencing any other symptoms (ex. SOB, nausea, vomiting, sweating)? NO  3. How long have you been experiencing CP? 1 WEEK  4. Is your CP continuous or coming and going? COMING AND GOING 5. Have you taken Nitroglycerin? YES ?

## 2017-08-25 NOTE — Progress Notes (Deleted)
Cardiology Office Note   Date:  08/25/2017   ID:  Robert Mcmahon, DOB March 13, 1986, MRN [id number]  PCP:  Patient, No Pcp Per  Cardiologist:  Hilty No chief complaint on file.    History of Present Illness: Robert SchatzCalvin Mcmahon is a 32 y.o. 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   He is most recent cardiac catheter on 09/20/2016 did not reveal any obstructive coronary artery disease.  The patient has had frequent ER visits for recurrent chest pain to the ER and has been ruled out for ACS each time his chest pain was reproducible and not found to be cardiac in etiology.      Past Medical History:  Diagnosis Date  . Chest pain 09/19/2016  . Depression   . Hypertension     Past Surgical History:  Procedure Laterality Date  . CARDIAC SURGERY  07/29/2016   mitral & tricuspid valve replacement  . LEFT HEART CATH AND CORONARY ANGIOGRAPHY N/A 09/20/2016   Procedure: Left Heart Cath and Coronary Angiography;  Surgeon: Lyn RecordsSmith, Henry W, MD;  [facility];  Service: Cardiovascular;  Laterality: N/A;     Current Outpatient Medications  Medication Sig Dispense Refill  . acetaminophen (TYLENOL) 325 MG tablet Take 975 mg by mouth every 6 (six) hours as needed for moderate pain.    . cetirizine (ZYRTEC) 10 MG tablet Take 1 tablet (10 mg total) by mouth daily. 90 tablet 0  . chlorhexidine (PERIDEX) 0.12 % solution Use as directed 15 mLs 2 (two) times daily in the mouth or throat. 120 mL 0  . colchicine 0.6 MG tablet Take 1 tablet (0.6 mg total) by mouth 2 (two) times daily as needed (gout attack). 20 tablet 0  . docusate sodium (COLACE) 100 MG capsule Take 100 mg by mouth daily as needed for mild  constipation.     . gabapentin (NEURONTIN) 600 MG tablet Take 0.5 tablets (300 mg total) by mouth 2 (two) times daily. 180 tablet 1  . ketorolac (TORADOL) 10 MG tablet Take 1 tablet (10 mg total) by mouth every 6 (six) hours as needed for severe pain. (Patient taking differently: Take 10 mg by mouth every 6 (six) hours as needed for severe pain. Per pharmacy, duration of therapy should not exceed 5 days a time.) 60 tablet 0  . metoprolol tartrate (LOPRESSOR) 25 MG tablet Take 1 tablet (25 mg total) by mouth every morning. 90 tablet 3  . nitroGLYCERIN (NITROSTAT) 0.4 MG SL tablet Place 1 tablet (0.4 mg total) under the tongue every 5 (five) minutes x 3 doses as needed for chest pain. 25 tablet 12  . pantoprazole (PROTONIX) 20 MG tablet Take 1 tablet (20 mg total) by mouth daily. 30 tablet 0  . promethazine-dextromethorphan (PROMETHAZINE-DM) 6.25-15 MG/5ML syrup Take 5 mLs 4 (four) times daily as needed by mouth for cough. 150 mL 0  . sucralfate (CARAFATE) 1 GM/10ML suspension Take 10 mLs (1 g total) by mouth 4 (four) times daily -  with meals and at bedtime. 420 mL 0  . warfarin (COUMADIN) 5 MG tablet TAKE 1 TABLET BY MOUTH ONCE DAILY AS DIRECTED 30 tablet 1  . warfarin (COUMADIN) 5 MG tablet TAKE 1 TABLET BY MOUTH ONCE DAILY AS  DIRECTED. FOLLOW UP WITH COUMADIN CLINIC PRIOR TO NEXT REFILL AUTHORIZATION 15 tablet 0   No current facility-administered medications for this visit.     Allergies:   Patient has no known allergies.    Social History:  The patient  reports that he has never smoked. He has never used smokeless tobacco. He reports that he does not drink alcohol or use drugs.   Family History:  The patient's family history includes Heart murmur in his mother.    ROS: All other systems are reviewed and negative. Unless otherwise mentioned in H&P    PHYSICAL EXAM: VS:  There were no vitals taken for this visit. , BMI There is no height or weight on file to calculate BMI. GEN: Well  nourished, well developed, in no acute distress HEENT: normal Neck: no JVD, carotid bruits, or masses Cardiac: ***RRR; no murmurs, rubs, or gallops,no edema  Respiratory:  clear to auscultation bilaterally, normal work of breathing GI: soft, nontender, nondistended, + BS MS: no deformity or atrophy Skin: warm and dry, no rash Neuro:  Strength and sensation are intact Psych: euthymic mood, full affect   EKG:  EKG {ACTION; IS/IS [id number]} ordered today. The ekg ordered today demonstrates ***   Recent Labs: 02/26/2017: BUN 11; Creatinine, Ser 0.96; Potassium 3.8; Sodium 136 02/28/2017: Hemoglobin 12.2; Platelets 263    Lipid Panel    Component Value Date/Time   CHOL 181 02/26/2017 0532   TRIG 71 02/26/2017 0532   HDL 37 (L) 02/26/2017 0532   CHOLHDL 4.9 02/26/2017 0532   VLDL 14 02/26/2017 0532   LDLCALC 130 (H) 02/26/2017 0532      Wt Readings from Last 3 Encounters:  07/16/17 144 lb 6.4 oz (65.5 kg)  05/21/17 142 lb (64.4 kg)  03/31/17 144 lb (65.3 kg)      Other studies Reviewed: Additional studies/ records that were reviewed today include: ***. Review of the above records demonstrates: ***   ASSESSMENT AND PLAN:  1.  ***   Current medicines are reviewed at length with the patient today.    Labs/ tests ordered today include: *** Bettey Mare. Liborio Nixon, ANP, AACC   08/25/2017 5:01 PM    [facility] [address] [place], [place] [address] Phone: [phone]; Fax: [phone]

## 2017-08-26 ENCOUNTER — Emergency Department (HOSPITAL_COMMUNITY)
Admission: EM | Admit: 2017-08-26 | Discharge: 2017-08-26 | Disposition: A | Discharge status: Home / Self Care | Payer: Medicaid Other | Attending: Emergency Medicine | Admitting: Emergency Medicine

## 2017-08-26 ENCOUNTER — Ambulatory Visit: Payer: Medicaid Other | Admitting: Adult Health

## 2017-08-26 ENCOUNTER — Emergency Department (HOSPITAL_COMMUNITY): Payer: Medicaid Other

## 2017-08-26 ENCOUNTER — Encounter (HOSPITAL_COMMUNITY): Payer: Self-pay | Admitting: Emergency Medicine

## 2017-08-26 ENCOUNTER — Other Ambulatory Visit: Payer: Self-pay

## 2017-08-26 DIAGNOSIS — I10 Essential (primary) hypertension: Secondary | ICD-10-CM | POA: Insufficient documentation

## 2017-08-26 DIAGNOSIS — Z79899 Other long term (current) drug therapy: Secondary | ICD-10-CM | POA: Insufficient documentation

## 2017-08-26 DIAGNOSIS — R0789 Other chest pain: Secondary | ICD-10-CM | POA: Insufficient documentation

## 2017-08-26 DIAGNOSIS — R079 Chest pain, unspecified: Secondary | ICD-10-CM | POA: Diagnosis present

## 2017-08-26 LAB — BASIC METABOLIC PANEL
ANION GAP: 8 (ref 5–15)
BUN: 11 mg/dL (ref 6–20)
CO2: 27 mmol/L (ref 22–32)
Calcium: 9.4 mg/dL (ref 8.9–10.3)
Chloride: 103 mmol/L (ref 101–111)
Creatinine, Ser: 0.97 mg/dL (ref 0.61–1.24)
Glucose, Bld: 100 mg/dL — ABNORMAL HIGH (ref 65–99)
Potassium: 4.1 mmol/L (ref 3.5–5.1)
SODIUM: 138 mmol/L (ref 135–145)

## 2017-08-26 LAB — CBC
HCT: 42.5 % (ref 39.0–52.0)
HEMOGLOBIN: 13.9 g/dL (ref 13.0–17.0)
MCH: 29.3 pg (ref 26.0–34.0)
MCHC: 32.7 g/dL (ref 30.0–36.0)
MCV: 89.5 fL (ref 78.0–100.0)
Platelets: 313 10*3/uL (ref 150–400)
RBC: 4.75 MIL/uL (ref 4.22–5.81)
RDW: 13.2 % (ref 11.5–15.5)
WBC: 6.2 10*3/uL (ref 4.0–10.5)

## 2017-08-26 LAB — I-STAT TROPONIN, ED: TROPONIN I, POC: 0.01 ng/mL (ref 0.00–0.08)

## 2017-08-26 LAB — PROTIME-INR
INR: 2.29
PROTHROMBIN TIME: 25 s — AB (ref 11.4–15.2)

## 2017-08-26 LAB — TROPONIN I: Troponin I: <0.03 ng/mL (ref <0.03)

## 2017-08-26 MED ORDER — FAMOTIDINE 20 MG PO TABS: 20.0000 mg | ORAL_TABLET | Freq: Two times a day (BID) | ORAL | 0 refills | Status: DC

## 2017-08-26 MED ORDER — FAMOTIDINE 20 MG PO TABS: 20.0000 mg | ORAL_TABLET | Freq: Two times a day (BID) | ORAL | 0 refills | Status: AC

## 2017-08-26 MED ORDER — MORPHINE SULFATE (PF) 4 MG/ML IV SOLN
4.0000 mg | Freq: Once | INTRAVENOUS | Status: DC
  Filled 2017-08-26: qty 1

## 2017-08-26 MED ORDER — MORPHINE SULFATE (PF) 4 MG/ML IV SOLN
4.0000 mg | Freq: Once | INTRAVENOUS | Status: AC
  Administered 2017-08-26: 4 mg via INTRAMUSCULAR

## 2017-08-26 NOTE — ED Triage Notes (Signed)
Pt to ER for evaluation of central chest burning onset one week ago. Denies any associated symptoms. Pt in NAD.

## 2017-08-26 NOTE — Discharge Instructions (Addendum)
As discussed, your evaluation today has been largely reassuring.  But, it is important that you monitor your condition carefully, and do not hesitate to return to the ED if you develop new, or concerning changes in your condition. ? ?Otherwise, please follow-up with your physician for appropriate ongoing care. ? ?

## 2017-08-26 NOTE — ED Notes (Addendum)
States had valve replacement ( mechanical)  in [place] last march 2018, moved to [place] right after in April  And got a Cards dr  Here , pt states he has cp for a few days had some sob yesterday states he called Dr Girard Cooterhiltons office and told them his pain meds were not helping Gabapentin  And the one that starts with a k, ? He can not remember. STATES HAD APPOINTMENT FOR TODAY BUT WAS IN SO MUCH PAin he came to ER,  States took aleve , motrin and a nitro YESTERDAY  But not today

## 2017-08-26 NOTE — ED Provider Notes (Signed)
[facility] EMERGENCY DEPARTMENT Provider Note   CSN: [id number] Arrival date & time: 08/26/17  4098     History   Chief Complaint Chief Complaint  Patient presents with  . Chest Pain    HPI Robert Mcmahon is a 32 y.o. male.  HPI Presents concern of chest pain. Patient awoke this morning about 10 hours ago with pain across the anterior superior thorax. Pain improved somewhat, and he was able to rest for a few hours, but the pain became more severe again about 5 hours ago. Since that time the pain is been severe, nonradiating, not improved with neuropathic pain medication. No new dyspnea, new fever, cough, chills. Patient has a notable history of open heart valve repair 1 year ago, and is currently taking Coumadin.  Past Medical History:  Diagnosis Date  . Chest pain 09/19/2016  . Depression   . Hypertension     Patient Active Problem List   Diagnosis Date Noted  . Neuropathic pain of chest 04/01/2017  . Long term (current) use of anticoagulants 04/01/2017  . Viral pharyngitis 04/01/2017  . Congenital heart disease   . Precordial chest pain   . NSTEMI (non-ST elevated myocardial infarction) (HCC) 02/25/2017  . Subtherapeutic international normalized ratio (INR) 02/25/2017  . H/O mitral valve replacement 09/19/2016  . Dyspnea 09/19/2016  . 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 Elevated troponin   . Chest wall pain 09/18/2016    Past Surgical History:  Procedure Laterality Date  . CARDIAC SURGERY  07/29/2016   mitral & tricuspid valve replacement  . LEFT HEART CATH AND CORONARY ANGIOGRAPHY N/A 09/20/2016   Procedure: Left Heart Cath and Coronary Angiography;  Surgeon: Lyn Records, MD;  Location: [facility] INVASIVE CV LAB;  Service: Cardiovascular;  Laterality: N/A;        Home Medications    Prior to Admission medications   Medication Sig Start Date End Date Taking? Authorizing Provider  cetirizine (ZYRTEC) 10 MG tablet Take 1 tablet (10 mg  total) by mouth daily. 07/16/17  Yes Jodelle Gross, NP  gabapentin (NEURONTIN) 600 MG tablet Take 0.5 tablets (300 mg total) by mouth 2 (two) times daily. 07/09/17  Yes Hilty, Lisette Abu, MD  metoprolol tartrate (LOPRESSOR) 25 MG tablet Take 1 tablet (25 mg total) by mouth every morning. 07/09/17  Yes Hilty, Lisette Abu, MD  nitroGLYCERIN (NITROSTAT) 0.4 MG SL tablet Place 1 tablet (0.4 mg total) under the tongue every 5 (five) minutes x 3 doses as needed for chest pain. 02/28/17  Yes Barrett, Joline Salt, PA-C  warfarin (COUMADIN) 5 MG tablet TAKE 1 TABLET BY MOUTH ONCE DAILY AS DIRECTED. FOLLOW UP WITH COUMADIN CLINIC PRIOR TO NEXT REFILL AUTHORIZATION Patient taking differently: Take 5 mg by mouth daily.  08/07/17  Yes Hilty, Lisette Abu, MD  chlorhexidine (PERIDEX) 0.12 % solution Use as directed 15 mLs 2 (two) times daily in the mouth or throat. Patient not taking: Reported on 08/26/2017 03/31/17   Fayrene Helper, PA-C  colchicine 0.6 MG tablet Take 1 tablet (0.6 mg total) by mouth 2 (two) times daily as needed (gout attack). Patient not taking: Reported on 08/26/2017 02/28/17   Barrett, Joline Salt, PA-C  famotidine (PEPCID) 20 MG tablet Take 1 tablet (20 mg total) by mouth 2 (two) times daily. Take one tablet twice daily 08/26/17   Gerhard Munch, MD  ketorolac (TORADOL) 10 MG tablet Take 1 tablet (10 mg total) by mouth every 6 (six) hours as needed for severe pain.  Patient not taking: Reported on 08/26/2017 07/09/17   Chrystie NoseHilty, Kenneth C, MD  pantoprazole (PROTONIX) 20 MG tablet Take 1 tablet (20 mg total) by mouth daily. Patient not taking: Reported on 08/26/2017 02/28/17 08/26/25  Barrett, Joline Salthonda G, PA-C  promethazine-dextromethorphan (PROMETHAZINE-DM) 6.25-15 MG/5ML syrup Take 5 mLs 4 (four) times daily as needed by mouth for cough. Patient not taking: Reported on 08/26/2017 03/31/17   Fayrene Helperran, Bowie, PA-C  sucralfate (CARAFATE) 1 GM/10ML suspension Take 10 mLs (1 g total) by mouth 4 (four) times daily -  with  meals and at bedtime. Patient not taking: Reported on 08/26/2017 02/28/17   Barrett, Joline Salthonda G, PA-C  warfarin (COUMADIN) 5 MG tablet TAKE 1 TABLET BY MOUTH ONCE DAILY AS DIRECTED Patient not taking: Reported on 08/26/2017 07/03/17   Chrystie NoseHilty, Kenneth C, MD    Family History Family History  Problem Relation Age of Onset  . Heart murmur Mother     Social History Social History   Tobacco Use  . Smoking status: Never Smoker  . Smokeless tobacco: Never Used  Substance Use Topics  . Alcohol use: No  . Drug use: No     Allergies   Patient has no known allergies.   Review of Systems Review of Systems  Constitutional:       Per HPI, otherwise negative  HENT:       Per HPI, otherwise negative  Respiratory:       Per HPI, otherwise negative  Cardiovascular:       Per HPI, otherwise negative  Gastrointestinal: Negative for vomiting.  Endocrine:       Negative aside from HPI  Genitourinary:       Neg aside from HPI   Musculoskeletal:       Per HPI, otherwise negative  Skin: Negative.   Neurological: Negative for syncope.     Physical Exam Updated Vital Signs BP 124/77   Pulse 64   Temp 98.2 F (36.8 C) (Oral)   Resp 18   SpO2 100%   Physical Exam  Constitutional: He is oriented to person, place, and time. He appears well-developed. No distress.  HENT:  Head: Normocephalic and atraumatic.  Eyes: Conjunctivae and EOM are normal.  Cardiovascular: Normal rate and regular rhythm.  Murmur heard. Pulmonary/Chest: Effort normal. No stridor. No respiratory distress.  Abdominal: He exhibits no distension.  Musculoskeletal: He exhibits no edema.  Neurological: He is alert and oriented to person, place, and time.  Skin: Skin is warm and dry.  Psychiatric: He has a normal mood and affect.  Nursing note and vitals reviewed.    ED Treatments / Results  Labs (all labs ordered are listed, but only abnormal results are displayed) Labs Reviewed  BASIC METABOLIC PANEL -  Abnormal; Notable for the following components:      Result Value   Glucose, Bld 100 (*)    All other components within normal limits  PROTIME-INR - Abnormal; Notable for the following components:   Prothrombin Time 25.0 (*)    All other components within normal limits  CBC  TROPONIN I  I-STAT TROPONIN, ED    EKG EKG Interpretation  Date/Time:  Tuesday August 26 2017 08:46:14 EDT Ventricular Rate:  59 PR Interval:  196 QRS Duration: 126 QT Interval:  418 QTC Calculation: 413 R Axis:   -47 Text Interpretation:  Sinus bradycardia Left axis deviation Left ventricular hypertrophy with QRS widening and repolarization abnormality Abnormal ECG increasing w wave abnormality inerior and lateral leads Confirmed by Linwood DibblesKnapp, Jon (  [id number]) on 08/26/2017 8:49:44 AM Also confirmed by Linwood Dibbles [phone]), editor Lanae Boast [phone])  on 08/26/2017 9:16:43 AM   Radiology Dg Chest 2 View  Result Date: 08/26/2017 CLINICAL DATA:  Chest pain for several days EXAM: CHEST - 2 VIEW COMPARISON:  02/25/2017 FINDINGS: Postsurgical changes are again noted and stable. The cardiac shadow is within normal limits. No focal infiltrate or sizable effusion is seen. No bony abnormality is noted. IMPRESSION: No active cardiopulmonary disease. Electronically Signed   By: Alcide Clever M.D.   On: 08/26/2017 09:05    Procedures Procedures (including critical care time)  Medications Ordered in ED Medications  morphine 4 MG/ML injection 4 mg (4 mg Intramuscular Given 08/26/17 1609)     Initial Impression / Assessment and Plan / ED Course  I have reviewed the triage vital signs and the nursing notes.  Pertinent labs & imaging results that were available during my care of the patient were reviewed by me and considered in my medical decision making (see chart for details).     5:32 PM Patient in no distress on repeat exam after second troponin has resulted with normal finding. Patient states that his pain is sometimes  worse after eating buffalo wings, or drinking soda These details are reassuring for low suspicion of coronary etiology, suspicion for gastroesophageal etiology. Patient is appropriately anticoagulated, and with his after mentioned reassuring findings, low suspicion for acute new pathology beyond gastroesophageal issues. Patient discharged with ongoing H2 blocker, primary care follow-up.  Final Clinical Impressions(s) / ED Diagnoses   Final diagnoses:  Atypical chest pain    ED Discharge Orders        Ordered    famotidine (PEPCID) 20 MG tablet  2 times daily,   Status:  Discontinued     08/26/17 1731    famotidine (PEPCID) 20 MG tablet  2 times daily     08/26/17 1732       Gerhard Munch, MD 08/26/17 1733

## 2017-08-27 ENCOUNTER — Encounter: Payer: Self-pay | Admitting: *Deleted

## 2017-09-16 ENCOUNTER — Ambulatory Visit: Payer: Medicaid Other | Admitting: Adult Health

## 2017-09-26 ENCOUNTER — Ambulatory Visit (INDEPENDENT_AMBULATORY_CARE_PROVIDER_SITE_OTHER): Payer: Medicaid Other | Admitting: Pharmacist Clinician (PhC)/ Clinical Pharmacy Specialist

## 2017-09-26 DIAGNOSIS — Z7901 Long term (current) use of anticoagulants: Secondary | ICD-10-CM

## 2017-09-26 DIAGNOSIS — Z952 Presence of prosthetic heart valve: Secondary | ICD-10-CM | POA: Diagnosis not present

## 2017-09-26 DIAGNOSIS — Z9889 Other specified postprocedural states: Secondary | ICD-10-CM

## 2017-09-26 LAB — POCT INR: INR: 2

## 2017-09-26 MED ORDER — CETIRIZINE HCL 10 MG PO TABS: 10.0000 mg | ORAL_TABLET | Freq: Every day | ORAL | 0 refills | Status: DC

## 2017-09-26 MED ORDER — WARFARIN SODIUM 5 MG PO TABS: ORAL_TABLET | ORAL | 1 refills | Status: DC

## 2017-09-26 NOTE — Patient Instructions (Signed)
Description   Take 2 tablets today ONLY, then increase dose to  (1 tablet) daily except 1.5 tablets each Wednesdays; Repeat INR in 2 weeks

## 2017-11-16 IMAGING — DX DG CHEST 2V
2 series · 2 of 2 positions shown · non-contrast
Comparison: 09/18/2016.

CLINICAL DATA: Mid chest pain.

EXAM:
CHEST  2 VIEW

[w chest pa]
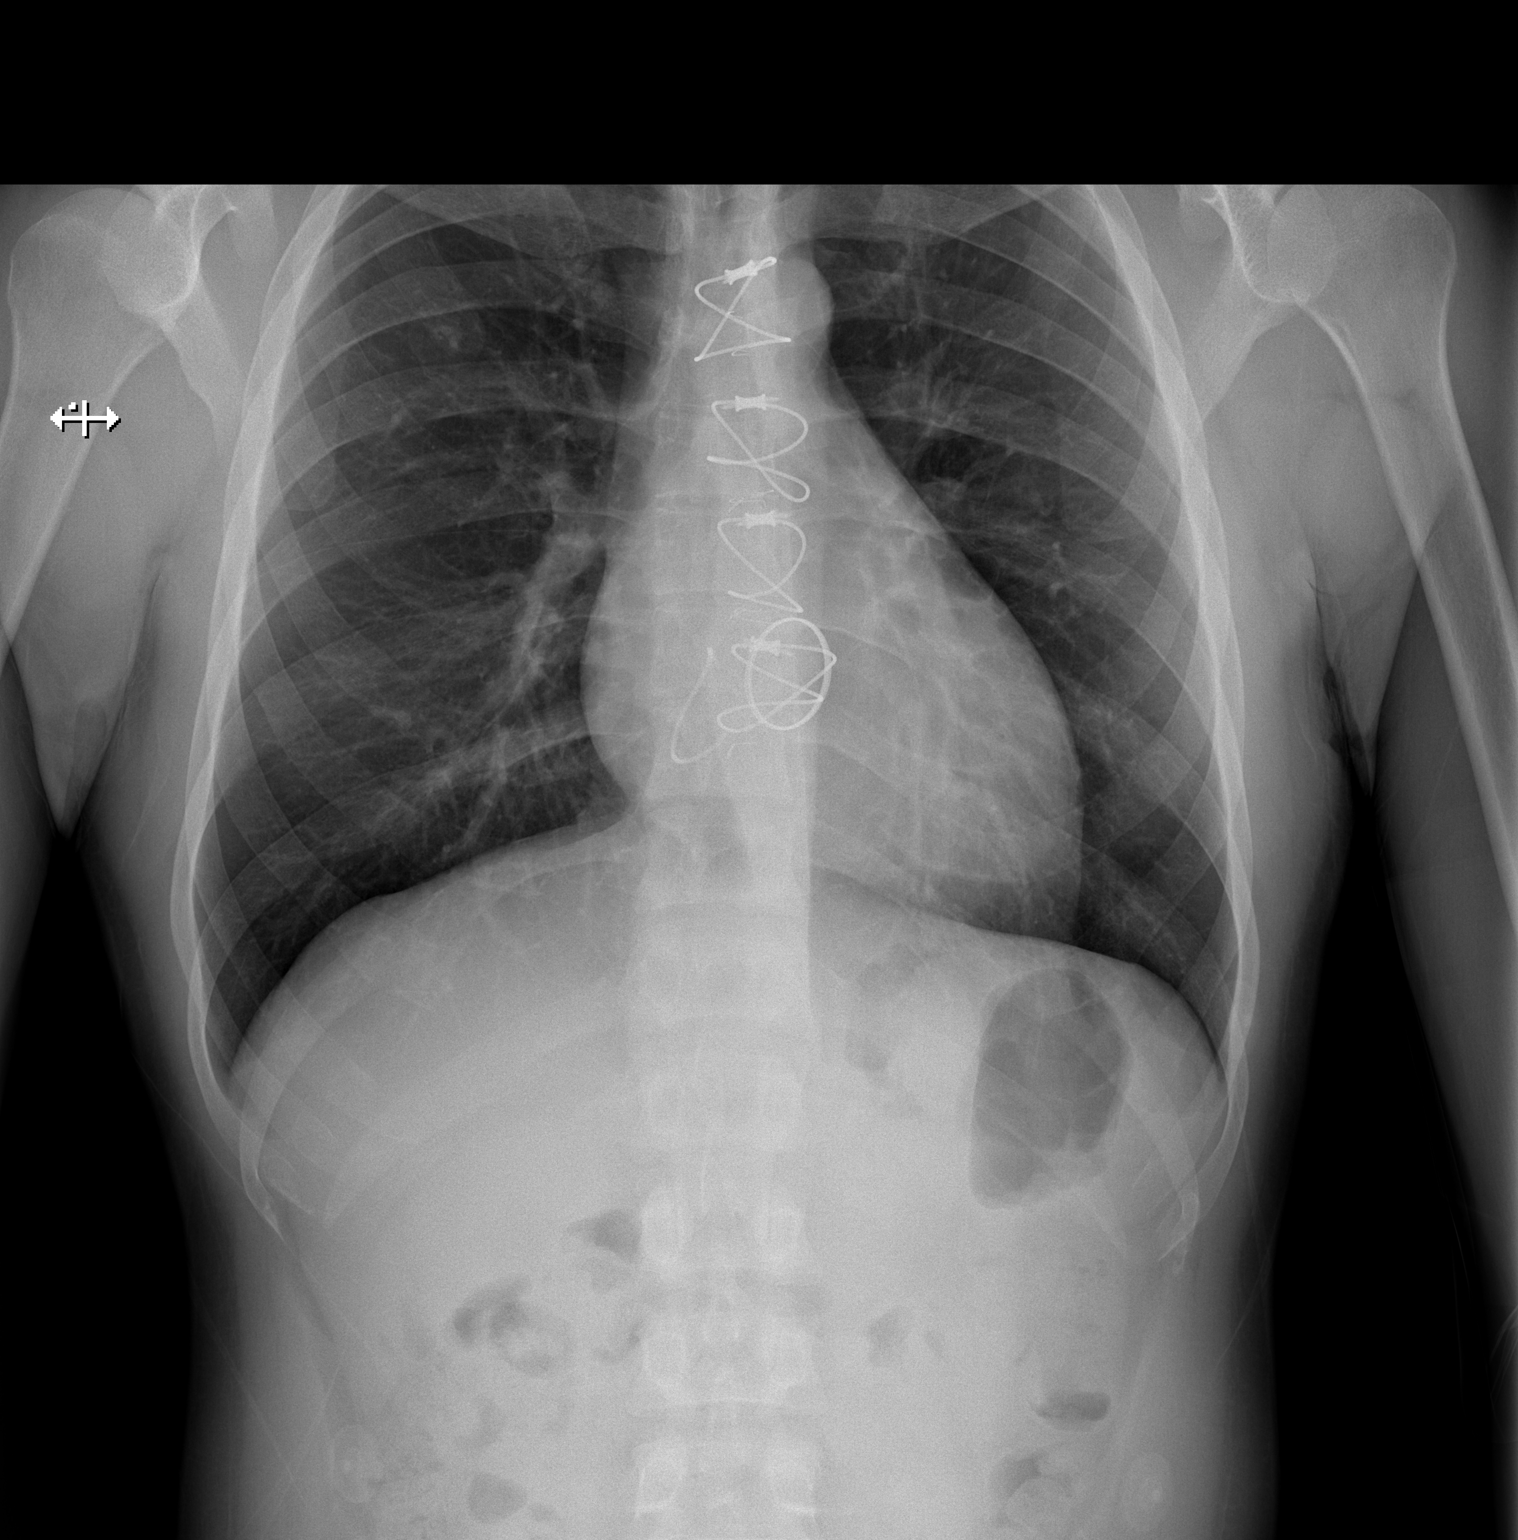

[w chest lat]
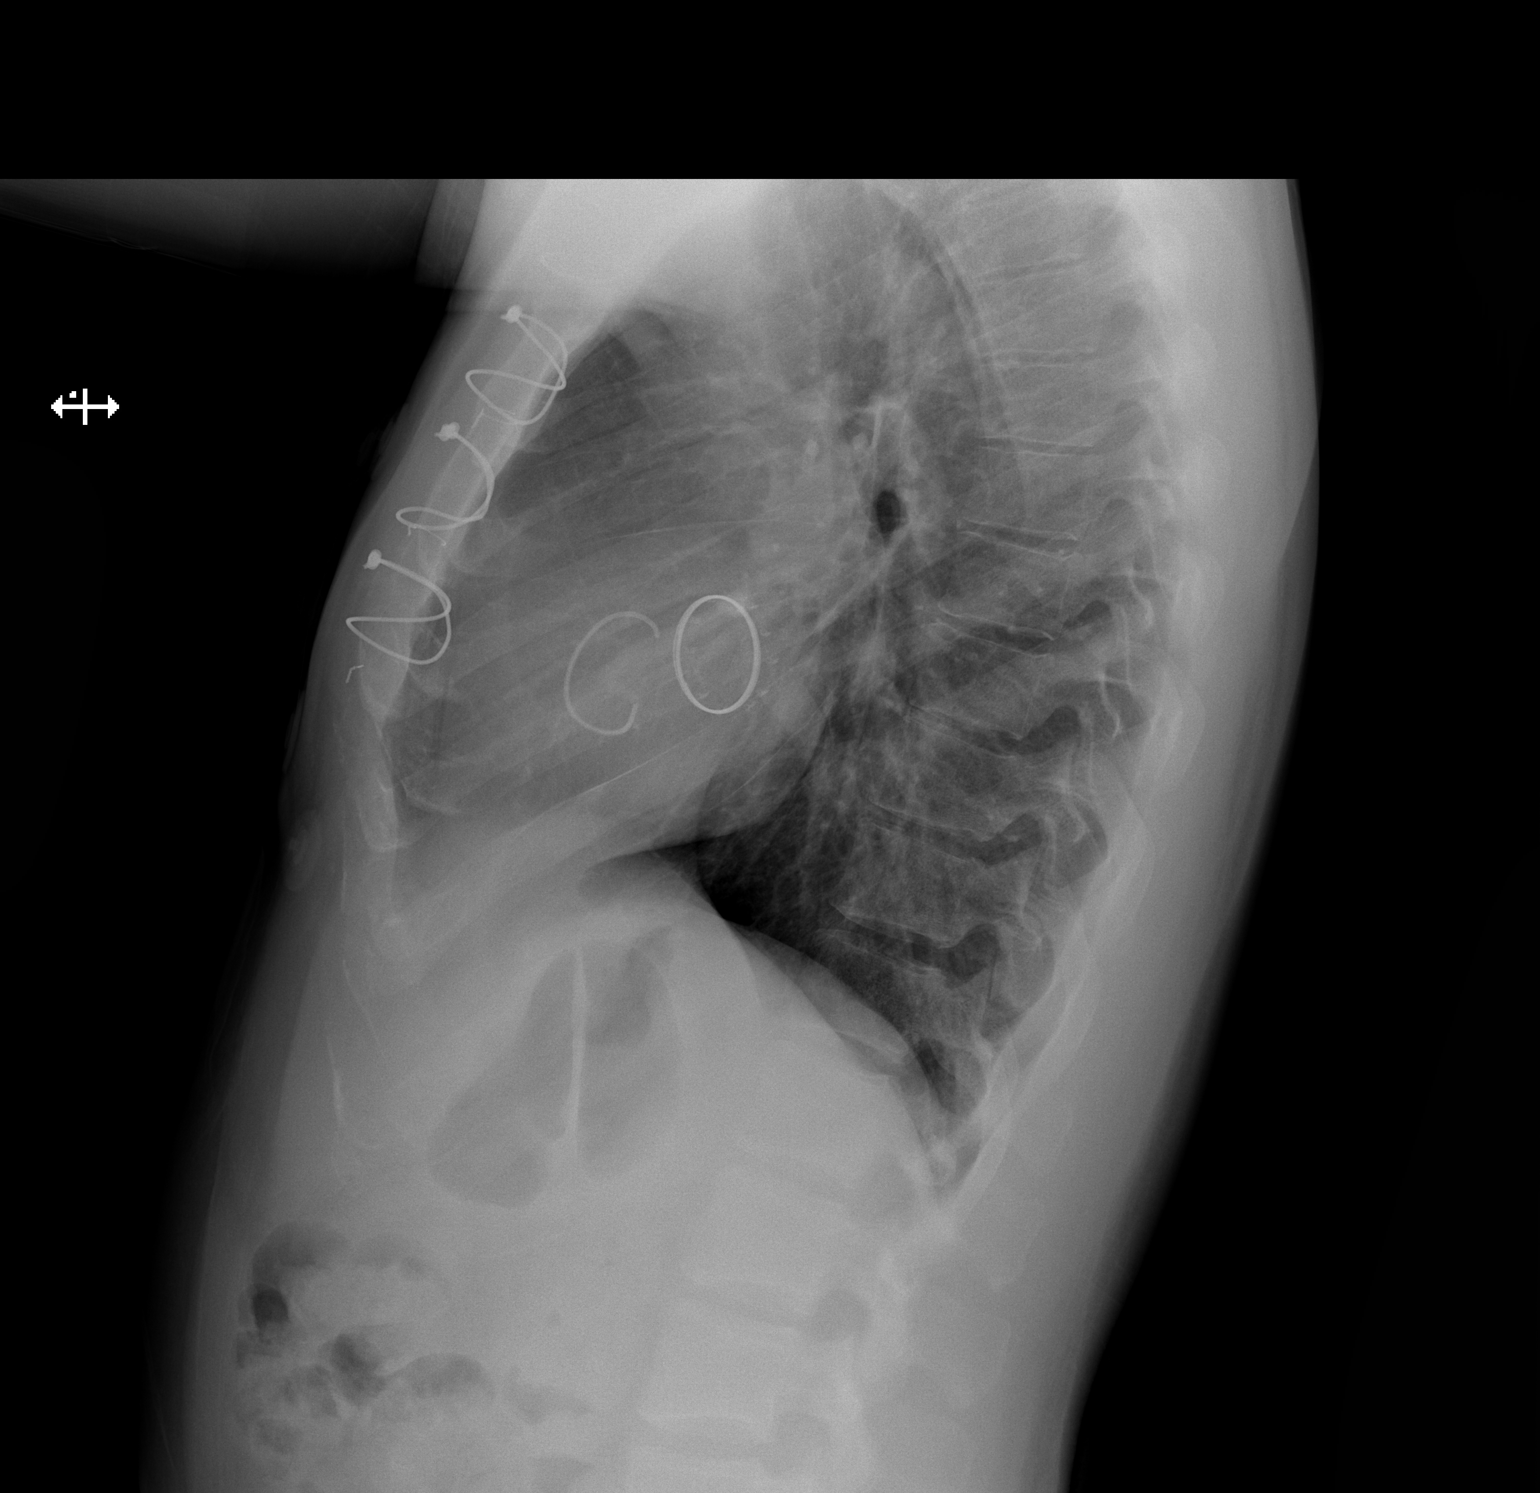

[2 of 2 positions shown; findings below may reference images not displayed]

FINDINGS: Stable median sternotomy wires and prosthetic heart valves. Clear
lungs with normal vascularity. Unremarkable bones.
IMPRESSION: No acute abnormality.

## 2017-12-12 ENCOUNTER — Other Ambulatory Visit: Payer: Self-pay | Admitting: Internal Medicine

## 2017-12-12 NOTE — Telephone Encounter (Signed)
Rx request sent to pharmacy.  

## 2017-12-12 NOTE — Telephone Encounter (Signed)
New Message:        *STAT* If patient is at the pharmacy, call can be transferred to refill team.   1. Which medications need to be refilled? (please list name of each medication and dose if known) warfarin (COUMADIN) 5 MG tablet  metoprolol tartrate (LOPRESSOR) 25 MG tablet   2. Which pharmacy/location (including street and city if local pharmacy) is medication to be sent to?[facility] [address] - [place], [place] - [address]  3. Do they need a 30 day or 90 day supply? 30

## 2017-12-15 ENCOUNTER — Other Ambulatory Visit: Payer: Self-pay | Admitting: *Deleted

## 2017-12-19 ENCOUNTER — Ambulatory Visit (INDEPENDENT_AMBULATORY_CARE_PROVIDER_SITE_OTHER): Payer: Medicaid Other | Admitting: Pharmacist Clinician (PhC)/ Clinical Pharmacy Specialist

## 2017-12-19 DIAGNOSIS — Z7901 Long term (current) use of anticoagulants: Secondary | ICD-10-CM | POA: Diagnosis not present

## 2017-12-19 DIAGNOSIS — Z952 Presence of prosthetic heart valve: Secondary | ICD-10-CM

## 2017-12-19 DIAGNOSIS — Z9889 Other specified postprocedural states: Secondary | ICD-10-CM

## 2017-12-19 LAB — POCT INR: INR: 1.5 — AB (ref 2.0–3.0)

## 2017-12-19 NOTE — Patient Instructions (Signed)
Description   Take 2 tablets today Friday Aug 9 then 1.5 tablets Saturday Aug 10.   Then continue with 5mg  (1 tablet) daily except 1.5 tablets each Wednesdays; Repeat INR in 2 weeks

## 2017-12-31 IMAGING — CR DG CHEST 2V
2 series · 2 of 2 positions shown · non-contrast
Comparison: Radiographs December 22, 2016.

CLINICAL DATA: Chest pain.

EXAM:
CHEST  2 VIEW

[chest pa]
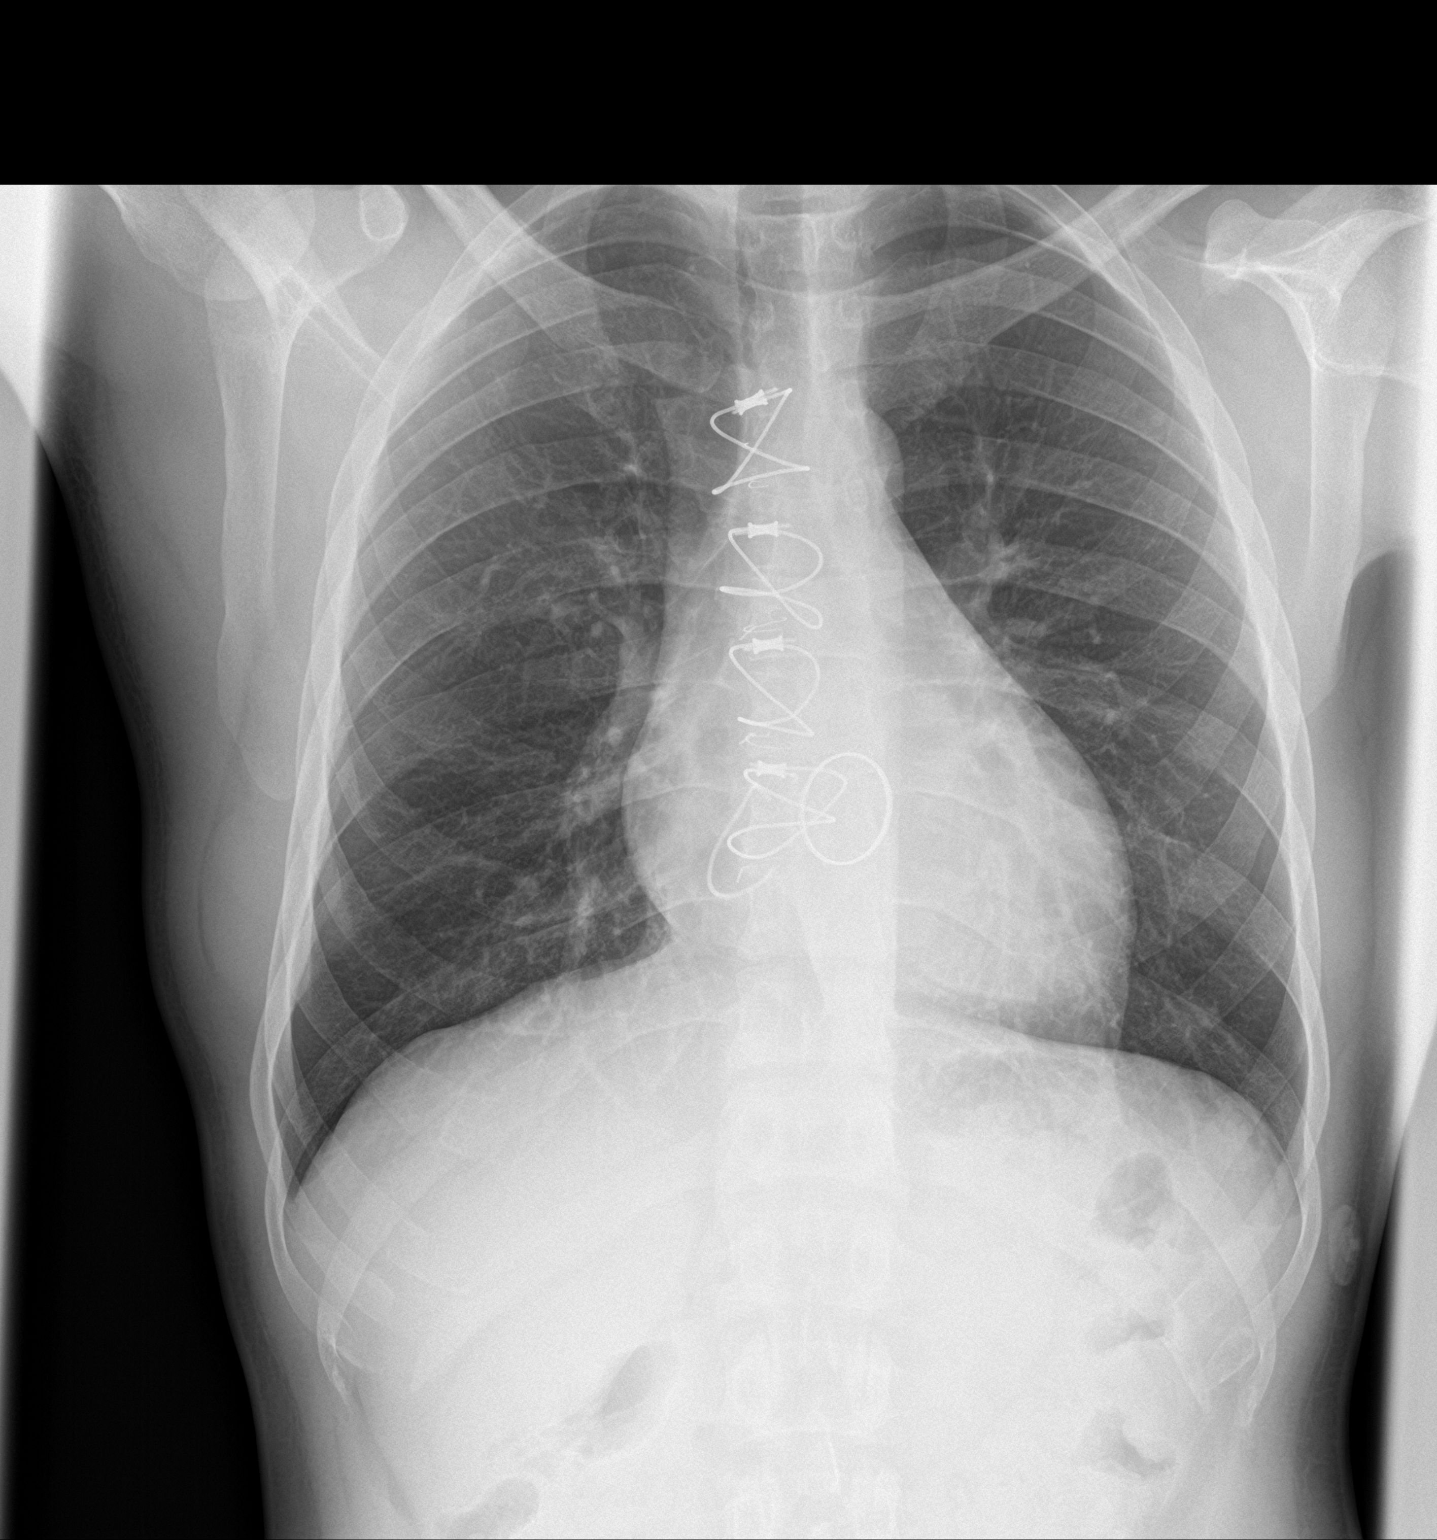

[chest lat]
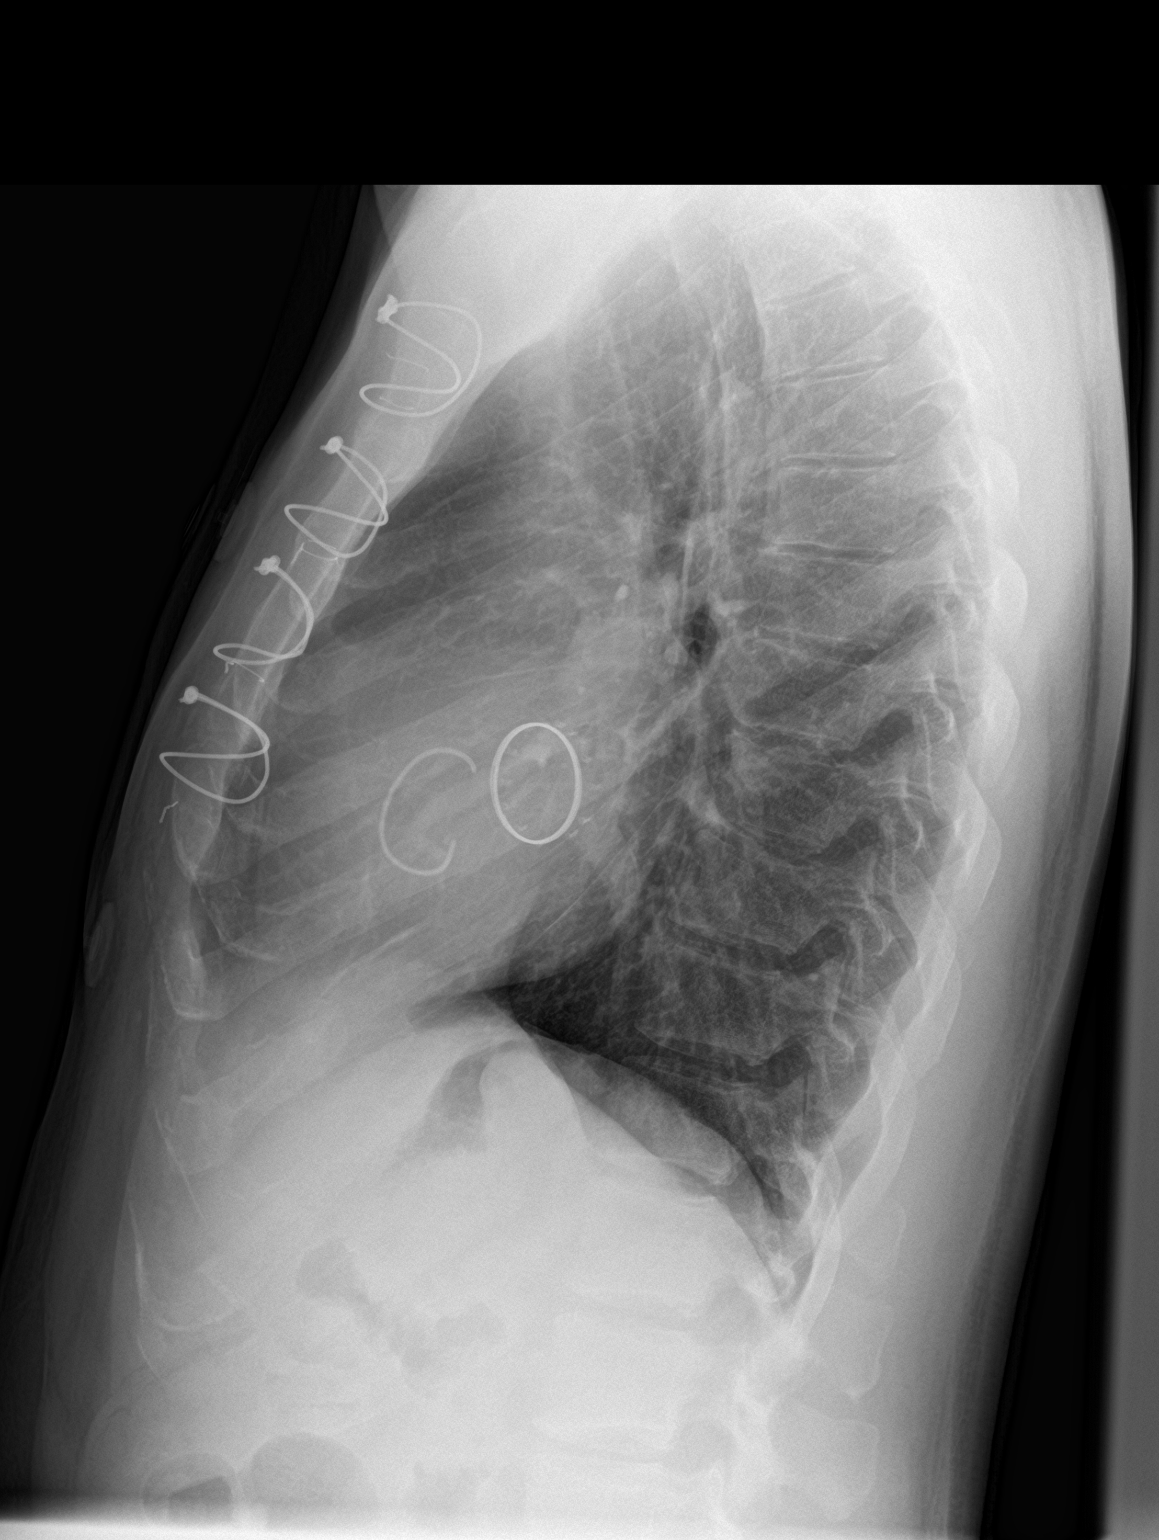

[2 of 2 positions shown; findings below may reference images not displayed]

FINDINGS: The heart size and mediastinal contours are within normal limits.
Status post cardiac valve repair. No pneumothorax or pleural
effusion is noted. Both lungs are clear. The visualized skeletal
structures are unremarkable.
IMPRESSION: No active cardiopulmonary disease.

## 2018-01-20 ENCOUNTER — Other Ambulatory Visit: Payer: Self-pay

## 2018-01-20 ENCOUNTER — Emergency Department (HOSPITAL_COMMUNITY)
Admission: EM | Admit: 2018-01-20 | Discharge: 2018-01-21 | Disposition: A | Discharge status: Home / Self Care | Payer: Medicaid Other | Attending: Emergency Medicine | Admitting: Emergency Medicine

## 2018-01-20 ENCOUNTER — Encounter (HOSPITAL_COMMUNITY): Payer: Self-pay | Admitting: Emergency Medicine

## 2018-01-20 ENCOUNTER — Emergency Department (HOSPITAL_COMMUNITY): Payer: Medicaid Other

## 2018-01-20 DIAGNOSIS — R079 Chest pain, unspecified: Secondary | ICD-10-CM | POA: Diagnosis present

## 2018-01-20 DIAGNOSIS — Z7901 Long term (current) use of anticoagulants: Secondary | ICD-10-CM | POA: Insufficient documentation

## 2018-01-20 DIAGNOSIS — I10 Essential (primary) hypertension: Secondary | ICD-10-CM | POA: Insufficient documentation

## 2018-01-20 DIAGNOSIS — Z952 Presence of prosthetic heart valve: Secondary | ICD-10-CM | POA: Diagnosis not present

## 2018-01-20 DIAGNOSIS — Z79899 Other long term (current) drug therapy: Secondary | ICD-10-CM | POA: Insufficient documentation

## 2018-01-20 DIAGNOSIS — Q249 Congenital malformation of heart, unspecified: Secondary | ICD-10-CM | POA: Diagnosis not present

## 2018-01-20 DIAGNOSIS — R0789 Other chest pain: Secondary | ICD-10-CM | POA: Insufficient documentation

## 2018-01-20 LAB — PROTIME-INR
INR: 3.22
PROTHROMBIN TIME: 32.6 s — AB (ref 11.4–15.2)

## 2018-01-20 LAB — CBC
HEMATOCRIT: 43.5 % (ref 39.0–52.0)
Hemoglobin: 14.1 g/dL (ref 13.0–17.0)
MCH: 29.4 pg (ref 26.0–34.0)
MCHC: 32.4 g/dL (ref 30.0–36.0)
MCV: 90.8 fL (ref 78.0–100.0)
PLATELETS: 290 10*3/uL (ref 150–400)
RBC: 4.79 MIL/uL (ref 4.22–5.81)
RDW: 12.7 % (ref 11.5–15.5)
WBC: 7.6 10*3/uL (ref 4.0–10.5)

## 2018-01-20 LAB — BASIC METABOLIC PANEL
ANION GAP: 10 (ref 5–15)
BUN: 9 mg/dL (ref 6–20)
CALCIUM: 9.8 mg/dL (ref 8.9–10.3)
CO2: 25 mmol/L (ref 22–32)
CREATININE: 0.97 mg/dL (ref 0.61–1.24)
Chloride: 103 mmol/L (ref 98–111)
GFR calc non Af Amer: >60 mL/min (ref >60)
Glucose, Bld: 95 mg/dL (ref 70–99)
Potassium: 3.9 mmol/L (ref 3.5–5.1)
SODIUM: 138 mmol/L (ref 135–145)

## 2018-01-20 LAB — I-STAT TROPONIN, ED: Troponin i, poc: 0.03 ng/mL (ref 0.00–0.08)

## 2018-01-20 NOTE — ED Triage Notes (Signed)
Pt reports generalized cp without radiation that started 3 days ago. Pt denies any other sx. Pt reports taking gabapentin and other otc medications without any relief. Pt is on Warfarin for a hx of heart valve replacement. Hx of HTN.

## 2018-01-20 NOTE — ED Provider Notes (Signed)
Patient placed in Quick Look pathway, seen and evaluated   Chief Complaint: Chest pain  HPI:  Substernal chest pain radiating "all over" for 3 days. Waxes and wanes, burning in nature. Improves with gabapentin and massage. Takes warfarin s/p valve replacement. Denies SOB, nausea, vomiting, diaphoresis, lightheadedness. Non-smoker. No recreational drug use.  Symptoms began after sleeping in clothing exposed to second-hand smoke.    ROS: Positive for chest pain Needed for shortness of breath, nausea, vomiting, diaphoresis, leg swelling  Physical Exam:   Gen: No distress  Neuro: Awake and Alert  Skin: Warm    Focused Exam: Heart regular rate and rhythm.  Mechanical valve clicking murmur noted.  Lungs clear to auscultation bilaterally. 2+ radial and DP/PT pulses bilaterally, Homans sign absent bilaterally, no lower extremity edema, no palpable cords, compartments are soft.  Anterior chest wall tender to palpation generally with no crepitus, deformity, ecchymosis, or flail segment.  Initiation of care has begun. The patient has been counseled on the process, plan, and necessity for staying for the completion/evaluation, and the remainder of the medical screening examination    Bennye Alm 01/20/18 1950    Lorre Nick, MD 01/21/18 [phone]

## 2018-01-21 MED ORDER — TRAMADOL HCL 50 MG PO TABS: 50.0000 mg | ORAL_TABLET | Freq: Four times a day (QID) | ORAL | 0 refills | Status: AC | PRN

## 2018-01-21 MED ORDER — TRAMADOL HCL 50 MG PO TABS
50.0000 mg | ORAL_TABLET | Freq: Once | ORAL | Status: AC
  Administered 2018-01-21: 50 mg via ORAL
  Filled 2018-01-21: qty 1

## 2018-01-21 NOTE — ED Provider Notes (Signed)
[facility] EMERGENCY DEPARTMENT Provider Note   CSN: [id number] Arrival date & time: 01/20/18  1940     History   Chief Complaint Chief Complaint  Patient presents with  . Chest Pain    HPI Robert Mcmahon is a 32 y.o. male.  Patient is a 32 year old male with past medical history of tricuspid and mitral valve replacements performed in [place] approximately 2 years ago.  He presents today for evaluation of chest discomfort.  This is been ongoing for the past 3 days.  He reports a constant, sharp pain to the center of his chest.  This is worse with palpation and movement.  He denies any fevers, chills, or cough.  He denies any nausea, diaphoresis, or or radiation.  Upon reviewing his records, he had a heart cath in May 2018 which revealed clean coronaries.  The history is provided by the patient.    Past Medical History:  Diagnosis Date  . Chest pain 09/19/2016  . Depression   . Hypertension     Patient Active Problem List   Diagnosis Date Noted  . Neuropathic pain of chest 04/01/2017  . Long term (current) use of anticoagulants 04/01/2017  . Viral pharyngitis 04/01/2017  . Congenital heart disease   . Precordial chest pain   . NSTEMI (non-ST elevated myocardial infarction) (HCC) 02/25/2017  . Subtherapeutic international normalized ratio (INR) 02/25/2017  . H/O mitral valve replacement 09/19/2016  . Dyspnea 09/19/2016  . 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 Elevated troponin   . Chest wall pain 09/18/2016    Past Surgical History:  Procedure Laterality Date  . CARDIAC SURGERY  07/29/2016   mitral & tricuspid valve replacement  . LEFT HEART CATH AND CORONARY ANGIOGRAPHY N/A 09/20/2016   Procedure: Left Heart Cath and Coronary Angiography;  Surgeon: Lyn Records, MD;  Location: [facility] INVASIVE CV LAB;  Service: Cardiovascular;  Laterality: N/A;        Home Medications    Prior to Admission medications   Medication Sig Start Date End  Date Taking? Authorizing Provider  cetirizine (ZYRTEC) 10 MG tablet Take 1 tablet (10 mg total) by mouth daily. 09/26/17   Hilty, Lisette Abu, MD  chlorhexidine (PERIDEX) 0.12 % solution Use as directed 15 mLs 2 (two) times daily in the mouth or throat. Patient not taking: Reported on 08/26/2017 03/31/17   Fayrene Helper, PA-C  colchicine 0.6 MG tablet Take 1 tablet (0.6 mg total) by mouth 2 (two) times daily as needed (gout attack). Patient not taking: Reported on 08/26/2017 02/28/17   Barrett, Joline Salt, PA-C  famotidine (PEPCID) 20 MG tablet Take 1 tablet (20 mg total) by mouth 2 (two) times daily. Take one tablet twice daily 08/26/17   Gerhard Munch, MD  gabapentin (NEURONTIN) 600 MG tablet Take 0.5 tablets (300 mg total) by mouth 2 (two) times daily. 07/09/17   Hilty, Lisette Abu, MD  ketorolac (TORADOL) 10 MG tablet Take 1 tablet (10 mg total) by mouth every 6 (six) hours as needed for severe pain. Patient not taking: Reported on 08/26/2017 07/09/17   Chrystie Nose, MD  metoprolol tartrate (LOPRESSOR) 25 MG tablet Take 1 tablet (25 mg total) by mouth every morning. 07/09/17   Hilty, Lisette Abu, MD  metoprolol tartrate (LOPRESSOR) 25 MG tablet TAKE 1 TABLET BY MOUTH ONCE DAILY IN THE MORNING 12/12/17   Hilty, Lisette Abu, MD  nitroGLYCERIN (NITROSTAT) 0.4 MG SL tablet Place 1 tablet (0.4 mg total) under the tongue every 5 (  five) minutes x 3 doses as needed for chest pain. 02/28/17   Barrett, Joline Salt, PA-C  pantoprazole (PROTONIX) 20 MG tablet Take 1 tablet (20 mg total) by mouth daily. Patient not taking: Reported on 08/26/2017 02/28/17 08/26/25  Barrett, Joline Salt, PA-C  promethazine-dextromethorphan (PROMETHAZINE-DM) 6.25-15 MG/5ML syrup Take 5 mLs 4 (four) times daily as needed by mouth for cough. Patient not taking: Reported on 08/26/2017 03/31/17   Fayrene Helper, PA-C  sucralfate (CARAFATE) 1 GM/10ML suspension Take 10 mLs (1 g total) by mouth 4 (four) times daily -  with meals and at bedtime. Patient not  taking: Reported on 08/26/2017 02/28/17   Barrett, Joline Salt, PA-C  warfarin (COUMADIN) 5 MG tablet TAKE 1 TO 1 & 1/2 (ONE TO ONE & ONE-HALF) TABLETS BY MOUTH ONCE DAILY AS DIRECTED BY  COUMADIN  CLINIC 12/19/17   Hilty, Lisette Abu, MD    Family History Family History  Problem Relation Age of Onset  . Heart murmur Mother     Social History Social History   Tobacco Use  . Smoking status: Never Smoker  . Smokeless tobacco: Never Used  Substance Use Topics  . Alcohol use: No  . Drug use: No     Allergies   Patient has no known allergies.   Review of Systems Review of Systems  All other systems reviewed and are negative.    Physical Exam Updated Vital Signs BP 135/88 (BP Location: Right Arm)   Pulse 64   Temp 97.9 F (36.6 C) (Oral)   Resp 19   Ht 5\' 7"  (1.702 m)   Wt 64.9 kg   SpO2 100%   BMI 22.40 kg/m   Physical Exam  Constitutional: He is oriented to person, place, and time. He appears well-developed and well-nourished. No distress.  HENT:  Head: Normocephalic and atraumatic.  Mouth/Throat: Oropharynx is clear and moist.  Neck: Normal range of motion. Neck supple.  Cardiovascular: Normal rate and regular rhythm. Exam reveals no friction rub.  No murmur heard. Pulmonary/Chest: Effort normal and breath sounds normal. No respiratory distress. He has no wheezes. He has no rales.  There is tenderness to palpation of the anterior chest wall.  This reproduces his symptoms.  Abdominal: Soft. Bowel sounds are normal. He exhibits no distension. There is no tenderness.  Musculoskeletal: Normal range of motion. He exhibits no edema.  Neurological: He is alert and oriented to person, place, and time. Coordination normal.  Skin: Skin is warm and dry. He is not diaphoretic.  Nursing note and vitals reviewed.    ED Treatments / Results  Labs (all labs ordered are listed, but only abnormal results are displayed) Labs Reviewed  PROTIME-INR - Abnormal; Notable for the  following components:      Result Value   Prothrombin Time 32.6 (*)    All other components within normal limits  BASIC METABOLIC PANEL  CBC  I-STAT TROPONIN, ED    EKG EKG Interpretation  Date/Time:  Tuesday January 20 2018 19:44:58 EDT Ventricular Rate:  67 PR Interval:  200 QRS Duration: 116 QT Interval:  372 QTC Calculation: 393 R Axis:   -44 Text Interpretation:  Normal sinus rhythm Possible Left atrial enlargement Left axis deviation Left ventricular hypertrophy with QRS widening ST & T wave abnormality, consider lateral ischemia Abnormal ECG Confirmed by Geoffery Lyons ([id number]) on 01/21/2018 1:36:47 AM   Radiology Dg Chest 2 View  Result Date: 01/20/2018 CLINICAL DATA:  Chest pain and shortness of breath EXAM: CHEST - 2 VIEW  COMPARISON:  August 26, 2017 FINDINGS: There is no edema or consolidation. Heart size and pulmonary vascularity are normal. No adenopathy. Patient is status post mitral and tricuspid valve replacements. No pneumothorax. No bone lesions. IMPRESSION: Status post mitral and tricuspid valve replacements. Heart size within normal limits. Lungs clear. Electronically Signed   By: Bretta Bang III M.D.   On: 01/20/2018 20:19    Procedures Procedures (including critical care time)  Medications Ordered in ED Medications  traMADol (ULTRAM) tablet 50 mg (has no administration in time range)     Initial Impression / Assessment and Plan / ED Course  I have reviewed the triage vital signs and the nursing notes.  Pertinent labs & imaging results that were available during my care of the patient were reviewed by me and considered in my medical decision making (see chart for details).  Patient presenting with sharp chest pain that is reproducible with palpation.  His work-up is unremarkable including troponin and EKG is unchanged.  He underwent a normal heart cath in 5 of 2018.  I highly doubt a cardiac etiology and believe he is appropriate for discharge.  He  is adequately anticoagulated with an INR of 3.2.  Symptoms inconsistent with PE and I feel as though this is extremely unlikely given his anticoagulated state.  Final Clinical Impressions(s) / ED Diagnoses   Final diagnoses:  None    ED Discharge Orders    None       Geoffery Lyons, MD 01/21/18 [phone]

## 2018-01-21 NOTE — Discharge Instructions (Addendum)
Tramadol as prescribed as needed for pain.  Follow-up with your cardiology if symptoms persist beyond the next 3 to 4 days.  Return to the emergency department in the meantime if symptoms significantly worsen or change.

## 2018-02-11 ENCOUNTER — Other Ambulatory Visit: Payer: Self-pay | Admitting: Internal Medicine

## 2018-02-11 ENCOUNTER — Other Ambulatory Visit: Payer: Self-pay | Admitting: Pharmacist

## 2018-02-11 MED ORDER — WARFARIN SODIUM 5 MG PO TABS: ORAL_TABLET | ORAL | 0 refills | Status: DC

## 2018-02-11 NOTE — Telephone Encounter (Signed)
°*  STAT* If patient is at the pharmacy, call can be transferred to refill team.   1. Which medications need to be refilled? (please list name of each medication and dose if known) warfarin 5 mg Cetirizine 10 mg metoprolol  Gabapentin 600 mg  2. Which pharmacy/location (including street and city if local pharmacy) is medication to be sent to? [facility] [address] [place] [address] [phone]  3. Do they need a 30 day or 90 day supply? 90

## 2018-02-12 MED ORDER — GABAPENTIN 600 MG PO TABS: 300.0000 mg | ORAL_TABLET | Freq: Two times a day (BID) | ORAL | 0 refills | Status: DC

## 2018-02-12 MED ORDER — METOPROLOL TARTRATE 25 MG PO TABS: ORAL_TABLET | ORAL | 0 refills | Status: DC

## 2018-02-12 MED ORDER — CETIRIZINE HCL 10 MG PO TABS: 10.0000 mg | ORAL_TABLET | Freq: Every day | ORAL | 0 refills | Status: DC

## 2018-02-12 NOTE — Telephone Encounter (Signed)
Spoke with patient who is visiting his son in [place]. He left [place] and ran out of his meds. He is thinking of coming back to [place]. Advised he will need an appt for future refills. He voiced understanding.  Rx(s) sent to pharmacy electronically Warfarin deferred to CVRR

## 2018-02-12 NOTE — Addendum Note (Signed)
Addended by: Lindell Spar on: 02/12/2018 03:44 PM   Modules accepted: Orders

## 2018-02-12 NOTE — Telephone Encounter (Signed)
Ok to refill - patient will need to be seen at least annually here, if he is in [place] now.  Dr. Rexene Edison

## 2018-02-16 ENCOUNTER — Other Ambulatory Visit: Payer: Self-pay | Admitting: Internal Medicine

## 2018-02-16 NOTE — Telephone Encounter (Signed)
New Message:   Per the patient the medication can only be sent to this pharmacy, please resend   *STAT* If patient is at the pharmacy, call can be transferred to refill team.   1. Which medications need to be refilled? (please list name of each medication and dose if known) warfarin (COUMADIN) 5 MG tablet,  gabapentin (NEURONTIN) 600 MG tablet and metoprolol tartrate (LOPRESSOR) 25 MG table and cetirizine (ZYRTEC) 10 MG tablet  2. Which pharmacy/location (including street and city if local pharmacy) is medication to be sent to? [facility] - [place], [place] - [address]  3. Do they need a 30 day or 90 day supply? 90 Days

## 2018-02-17 ENCOUNTER — Ambulatory Visit: Payer: Self-pay | Admitting: Pharmacist Clinician (PhC)/ Clinical Pharmacy Specialist

## 2018-02-17 DIAGNOSIS — Z952 Presence of prosthetic heart valve: Secondary | ICD-10-CM

## 2018-02-17 DIAGNOSIS — Z9889 Other specified postprocedural states: Secondary | ICD-10-CM

## 2018-02-18 ENCOUNTER — Telehealth: Payer: Self-pay | Admitting: Internal Medicine

## 2018-02-18 NOTE — Telephone Encounter (Signed)
°*  STAT* If patient is at the pharmacy, call can be transferred to refill team.   1. Which medications need to be refilled? (please list name of each medication and dose if known) Warfarin and Clartin D 24  2. Which pharmacy/location (including street and city if local pharmacy) is medication to be sent to? [facility] [phone]  3. Do they need a 30 day or 90 day supply? 90 and refills for both please*

## 2018-02-19 ENCOUNTER — Telehealth: Payer: Self-pay | Admitting: Internal Medicine

## 2018-02-19 MED ORDER — WARFARIN SODIUM 5 MG PO TABS: ORAL_TABLET | ORAL | 0 refills | Status: DC

## 2018-02-19 NOTE — Telephone Encounter (Signed)
New Message:     *STAT* If patient is at the pharmacy, call can be transferred to refill team.   1. Which medications need to be refilled? (please list name of each medication and dose if known) warfarin (COUMADIN) 5 MG tablet and cetirizine (ZYRTEC) 10 MG tablet  2. Which pharmacy/location (including street and city if local pharmacy) is medication to be sent to? [facility] - [place], [place] - [address]  3. Do they need a 30 day or 90 day supply? 90 Days

## 2018-02-19 NOTE — Telephone Encounter (Signed)
10 days supply send to prefer pharmacy in [place]. Patient overdue to INR check. We contacted him few days ago for follow up.  Needs INR check prior to warfarin refill authorization.

## 2018-02-27 ENCOUNTER — Encounter: Payer: Self-pay | Admitting: Internal Medicine

## 2018-02-27 ENCOUNTER — Ambulatory Visit (INDEPENDENT_AMBULATORY_CARE_PROVIDER_SITE_OTHER): Payer: Medicaid Other | Admitting: Internal Medicine

## 2018-02-27 ENCOUNTER — Ambulatory Visit (INDEPENDENT_AMBULATORY_CARE_PROVIDER_SITE_OTHER): Payer: Medicaid Other | Admitting: Pharmacist Clinician (PhC)/ Clinical Pharmacy Specialist

## 2018-02-27 VITALS — BP 122/80 | HR 92 | Ht 67.0 in | Wt 150.2 lb

## 2018-02-27 DIAGNOSIS — Z952 Presence of prosthetic heart valve: Secondary | ICD-10-CM | POA: Diagnosis not present

## 2018-02-27 DIAGNOSIS — Z7901 Long term (current) use of anticoagulants: Secondary | ICD-10-CM

## 2018-02-27 DIAGNOSIS — Z9889 Other specified postprocedural states: Secondary | ICD-10-CM

## 2018-02-27 LAB — POCT INR: INR: 2.8 (ref 2.0–3.0)

## 2018-02-27 MED ORDER — WARFARIN SODIUM 5 MG PO TABS: ORAL_TABLET | ORAL | 0 refills | Status: AC

## 2018-02-27 MED ORDER — CETIRIZINE HCL 10 MG PO TABS: 10.0000 mg | ORAL_TABLET | Freq: Every day | ORAL | 3 refills | Status: AC

## 2018-02-27 NOTE — Progress Notes (Signed)
OFFICE FOLLOW-UP NOTE  Chief Complaint:  Follow-up chest pain  Primary Care Physician: Patient, No Pcp Per  HPI:  Robert Mcmahon is a 32 y.o. 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 Jude Masters series mechanical mitral valve and concomitant tricuspid valve annuloplasty with a 28 mm Medtronic contour 3-D ring in [place]. He was transitioning to living in [place]. He presented to the hospital in May with chest pain and shortness of breath. He was found to have an elevated troponin which rose and fell consistent more with an ACS pattern. His INR was subtherapeutic on admission and I was concerned about possibly a coronary thrombus. Although he reported having had heart catheterization prior to valve surgery, we could only find records of his right heart catheterization. My experience is not unusual for young patient did not have left heart catheterization prior to valve surgery given the low likelihood of coronary disease. Based on these findings I thought it prudent for him to undergo left heart catheterization. He did have left heart catheterization with Dr. Katrinka Blazing on 09/20/2016. This was noted to be difficult procedure from the radial approach. He was not found to have any obstructive coronary disease. He was transitioned back to warfarin and presents today for follow-up. Since discharge he said no further chest pain or worsening shortness of breath. INR most recently was therapeutic. INR today is at goal at 2.4. He says that he may stay in [place] for the summer but could transition back to [place]. He also recently saw his surgeon who is requesting records of this hospitalization.  01/01/2017  Robert Mcmahon was recently seen in the ER with complaints of chest pain. He ruled out for MI and was not felt to be cardiac. He says he's been under less stress and is trying to get SSDI.  His INR was slightly subtherapeutic. He is due for recheck and will probably check that today. It started some vitamins, possibly containing vitamin K which may be interfered with his INRs. He had planted going back to [place] but is currently staying in [place] with his family. We discussed work and he mentioned that he used to work for [employer], but had passed out which led to finding of his valvular heart disease. Recently however he's done very well after valve replacement and really has no cardiac limitations that I'm aware of that would keep him from working.  01/30/2017  Robert Mcmahon was recently seen again in the emergency department. He had some recurrent chest pain and was evaluated and found to have a normal troponin. He says his pain is been persistent now for about 2 weeks. It's located across the anterior chest and worse when taking a deep breath. His INR was therapeutic at 2.06.  04/01/2017  Robert Mcmahon returns today for follow-up of recent hospitalization.  He was discharged on October 19 after presentation with chest pain and being found to again have elevated troponin.  This is similar to his presentation earlier this year, noting that his INR again was subtherapeutic at 1.25 on admission.  It is thought this possibly could have put him at risk for coronary arterial emboli related to his mechanical mitral valve.  His chest pain was atypical and reproducible.  He was started on gabapentin but reports it is not helped.  He has doubled the dose from 300 mg twice daily to 600  mg twice daily on his own without significant improvement.  Today he is complaining of throat pain and difficulty swallowing, but denies any fever or chills, cough or associated symptoms.  He is concerned about possible strep throat, however I informed him we did not have the capabilities to do that testing here.  He is also overdue for repeat INR testing, which we will schedule.  He should be followed twice  monthly.  Finally he is applying for disability.  He reports frequent chest pain and wishes for me to write a letter to describe how this interferes with his ability to work.  He does not have a primary care provider, and states he still having problems transferring his insurance from [place] to [place].  05/21/2017  Robert Mcmahon returns today for follow-up.  He denies any current chest pain.  Recently he was having some more episodes of chest pain however his grandmother was ill with cancer.  Unfortunately she died over the holidays.  This been very difficult for him.  Today he has had about 24 hours of some nausea, vomiting and diarrhea and is noted to be congested today.  He said it is more likely his allergic symptoms.  He thought he had a fever last night.  Some members of his family had similar symptoms this past week.  He denies any body aches and temperature in the office today was 98.2.  He is overdue for repeat INR check.  He did not qualify for disability.  He is not currently working.  He is going to try to see if he can find employment this year.  02/27/2018  Robert Mcmahon is seen today in follow-up.  He has been back and forth to [place] and he has been caring for his son.  He says he is overall feeling better.  He occasionally gets symptoms with chest discomfort.  A lot of this seems to be related to stress.  He also had a cousin who died recently.  He is dealing with that.  His INR was 2.8 today.  We refilled medications for him including warfarin.  PMHx:  Past Medical History:  Diagnosis Date  . Chest pain 09/19/2016  . Depression   . Hypertension     Past Surgical History:  Procedure Laterality Date  . CARDIAC SURGERY  07/29/2016   mitral & tricuspid valve replacement  . LEFT HEART CATH AND CORONARY ANGIOGRAPHY N/A 09/20/2016   Procedure: Left Heart Cath and Coronary Angiography;  Surgeon: Lyn Records, MD;  Location: [facility] INVASIVE CV LAB;  Service: Cardiovascular;   Laterality: N/A;    FAMHx:  Family History  Problem Relation Age of Onset  . Heart murmur Mother     SOCHx:   reports that he has never smoked. He has never used smokeless tobacco. He reports that he does not drink alcohol or use drugs.  ALLERGIES:  No Known Allergies  ROS: Pertinent items noted in HPI and remainder of comprehensive ROS otherwise negative.  HOME MEDS: Current Outpatient Medications on File Prior to Visit  Medication Sig Dispense Refill  . cetirizine (ZYRTEC) 10 MG tablet Take 1 tablet (10 mg total) by mouth daily. 90 tablet 0  . famotidine (PEPCID) 20 MG tablet Take 1 tablet (20 mg total) by mouth 2 (two) times daily. Take one tablet twice daily 30 tablet 0  . gabapentin (NEURONTIN) 600 MG tablet Take 0.5 tablets (300 mg total) by mouth 2 (two) times daily. 90 tablet 0  . metoprolol  tartrate (LOPRESSOR) 25 MG tablet TAKE 1 TABLET BY MOUTH ONCE DAILY IN THE MORNING 90 tablet 0  . nitroGLYCERIN (NITROSTAT) 0.4 MG SL tablet Place 1 tablet (0.4 mg total) under the tongue every 5 (five) minutes x 3 doses as needed for chest pain. 25 tablet 12  . traMADol (ULTRAM) 50 MG tablet Take 1 tablet (50 mg total) by mouth every 6 (six) hours as needed. 12 tablet 0  . warfarin (COUMADIN) 5 MG tablet TAKE 1 TO 1.5 TABLETS BY MOUTH DAILY AS DIRECTED BY COUMADIN  CLINIC. INR needed PRIOR TO NEXT REFILL. 15 tablet 0   No current facility-administered medications on file prior to visit.     LABS/IMAGING: Results for orders placed or performed in visit on 02/27/18 (from the past 48 hour(s))  POCT INR     Status: None   Collection Time: 02/27/18  8:16 AM  Result Value Ref Range   INR 2.8 2.0 - 3.0   No results found.  LIPID PANEL:    Component Value Date/Time   CHOL 181 02/26/2017 0532   TRIG 71 02/26/2017 0532   HDL 37 (L) 02/26/2017 0532   CHOLHDL 4.9 02/26/2017 0532   VLDL 14 02/26/2017 0532   LDLCALC 130 (H) 02/26/2017 0532     WEIGHTS: Wt Readings from Last 3  Encounters:  02/27/18 150 lb 3.2 oz (68.1 kg)  01/20/18 143 lb (64.9 kg)  07/16/17 144 lb 6.4 oz (65.5 kg)    VITALS: BP 122/80   Pulse 92   Ht 5\' 7"  (1.702 m)   Wt 150 lb 3.2 oz (68.1 kg)   SpO2 98%   BMI 23.52 kg/m   EXAM: General appearance: alert, no distress and Thin Neck: no carotid bruit and no JVD Lungs: clear to auscultation bilaterally Heart: regular rate and rhythm, S1, S2 normal, ejection click present and no rub Abdomen: soft, non-tender; bowel sounds normal; no masses,  no organomegaly Extremities: extremities normal, atraumatic, no cyanosis or edema Pulses: 2+ and symmetric Skin: Skin color, texture, turgor normal. No rashes or lesions Neurologic: Grossly normal Psych: Pleasant  EKG: Deferred  ASSESSMENT: 1. Chest wall pain - ?neuropathic pain, pleurisy, costochondritis 2. Elevated troponin-possibly related to transient coronary thrombus 3. 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 Jude Master series mitral valve replacement 4. History of tricuspid regurgitation status post 28 mm Medtronic 3-D contour series tricuspid annuloplasty ring 5. Long-term anticoagulation on warfarin   PLAN: 1.   Robert Mcmahon seems to be doing much better with marked decrease in chest wall pain.  Again I suspect this is all related to stress and anxiety.  He did have a cousin who died recently which worsened his symptoms however they have now improved.  He is traveling back and forth between [place] and [place] to spend time with his son.  We will plan to see him back here annually or sooner as necessary.  He will likely need a repeat echo next year.  Follow-up annually or sooner as necessary.  Chrystie Nose, MD, [facility], FACP  [place]  [facility]  Medical Director of the Advanced Lipid Disorders &  Cardiovascular Risk Reduction Clinic Diplomate of the American Board of Clinical Lipidology Attending Cardiologist  Direct Dial: [phone]  Fax:  [phone]  Website:  [email or web address] Nicely Kimyetta Flott 02/27/2018, 8:18 AM

## 2018-02-27 NOTE — Patient Instructions (Signed)
Medication Instructions:  Continue current medications If you need a refill on your cardiac medications before your next appointment, please call your pharmacy.   Follow-Up: At [facility], you and your health needs are our priority.  As part of our continuing mission to provide you with exceptional heart care, we have created designated Provider Care Teams.  These Care Teams include your primary Cardiologist (physician) and Advanced Practice Providers (APPs -  Physician Assistants and Nurse Practitioners) who all work together to provide you with the care you need, when you need it. You will need a follow up appointment in 6 months.  Please call our office 2 months in advance to schedule this appointment.  You may see Kenneth C Hilty, MD or one of the following Advanced Practice Providers on your designated Care Team: Hao Meng, PA-C . Angela Duke, PA-C  Any Other Special Instructions Will Be Listed Below (If Applicable).    

## 2018-04-14 ENCOUNTER — Telehealth: Payer: Self-pay

## 2018-04-14 NOTE — Telephone Encounter (Signed)
Left msg overdue inr 

## 2018-05-22 NOTE — Telephone Encounter (Signed)
Called to reschedule overdue inr left msg 

## 2018-05-25 NOTE — Telephone Encounter (Signed)
Called pt to schedule overdue inr left msg 

## 2018-05-26 NOTE — Telephone Encounter (Signed)
Left 4th and final msg for overdue inr

## 2018-05-27 ENCOUNTER — Telehealth: Payer: Self-pay | Admitting: Pharmacist

## 2018-05-27 ENCOUNTER — Ambulatory Visit: Payer: Self-pay | Admitting: Pharmacist

## 2018-05-27 DIAGNOSIS — Z952 Presence of prosthetic heart valve: Secondary | ICD-10-CM

## 2018-05-27 DIAGNOSIS — Z7901 Long term (current) use of anticoagulants: Secondary | ICD-10-CM

## 2018-05-27 NOTE — Telephone Encounter (Signed)
Patient back in [place]. Established with new coumadin clinic.

## 2018-05-27 NOTE — Telephone Encounter (Signed)
-----   Message from [place], [place] sent at 05/26/2018  4:23 PM EST ----- Regarding: overdue inr final msg Left 4th and final msg

## 2018-08-04 ENCOUNTER — Other Ambulatory Visit: Payer: Self-pay | Admitting: Internal Medicine

## 2018-08-04 NOTE — Telephone Encounter (Signed)
DPR on file with ok to leave a detailed message on the pt voicemail. Pt called to inquire about Tramadol. We do not refill or manage pain medication. Pt can call back to discuss if any questions or concerns.

## 2018-08-04 NOTE — Telephone Encounter (Signed)
New Message:   Please call, pt says he needs to talk to you about his Tramadol please.

## 2018-08-05 ENCOUNTER — Other Ambulatory Visit: Payer: Self-pay | Admitting: *Deleted

## 2018-08-05 MED ORDER — GABAPENTIN 600 MG PO TABS: 300.0000 mg | ORAL_TABLET | Freq: Two times a day (BID) | ORAL | 0 refills | Status: AC

## 2018-08-05 MED ORDER — GABAPENTIN 600 MG PO TABS: 300.0000 mg | ORAL_TABLET | Freq: Two times a day (BID) | ORAL | 2 refills | Status: DC

## 2018-08-05 NOTE — Telephone Encounter (Signed)
Per pt states that Dr Rennis Golden filled Gabapentin in past and is needing refill Will forward to Dr Rennis Golden to see if okay to fill [facility]

## 2018-08-05 NOTE — Telephone Encounter (Signed)
Ok to refill gabapentin for at least 6 months worth of medication.  Dr. Rexene Edison

## 2018-08-05 NOTE — Telephone Encounter (Signed)
Pt aware script sent to Pharmacy [facility]

## 2018-08-05 NOTE — Telephone Encounter (Signed)
Follow Up: ° ° ° °Returning your call from yesterday. °

## 2018-09-07 ENCOUNTER — Other Ambulatory Visit: Payer: Self-pay

## 2018-09-07 MED ORDER — METOPROLOL TARTRATE 25 MG PO TABS: ORAL_TABLET | ORAL | 1 refills | Status: DC

## 2018-09-14 ENCOUNTER — Telehealth: Payer: Self-pay | Admitting: Internal Medicine

## 2018-09-14 NOTE — Telephone Encounter (Signed)
New message     Virtual video visit scheduled for 09-15-18.  Pt now lives in [place] but had to cancel appt with new cardiologist in march due to COVID 19.  He was happy to have another visit with Dr Rennis Golden and gave consent for virtual visit.  YOUR CARDIOLOGY TEAM HAS ARRANGED FOR AN E-VISIT FOR YOUR APPOINTMENT - PLEASE REVIEW IMPORTANT INFORMATION BELOW SEVERAL DAYS PRIOR TO YOUR APPOINTMENT  Due to the recent COVID-19 pandemic, we are transitioning in-person office visits to tele-medicine visits in an effort to decrease unnecessary exposure to our patients, their families, and staff. These visits are billed to your insurance just like a normal visit is. We also encourage you to sign up for MyChart if you have not already done so. You will need a smartphone if possible. For patients that do not have this, we can still complete the visit using a regular telephone but do prefer a smartphone to enable video when possible. You may have a family member that lives with you that can help. If possible, we also ask that you have a blood pressure cuff and scale at home to measure your blood pressure, heart rate and weight prior to your scheduled appointment. Patients with clinical needs that need an in-person evaluation and testing will still be able to come to the office if absolutely necessary. If you have any questions, feel free to call our office.     YOUR PROVIDER WILL BE USING THE FOLLOWING PLATFORM TO COMPLETE YOUR VISIT: Doximity   IF USING MYCHART - How to Download the MyChart App to Your SmartPhone   - If Apple, go to [employer] and type in MyChart in the search bar and download the app. If Android, ask patient to go to [employer] and type in [place] in the search bar and download the app. The app is free but as with any other app downloads, your phone may require you to verify saved payment information or Apple/Android password.  - You will need to then log into the app with your  MyChart username and password, and select [facility] as your healthcare provider to link the account.  - When it is time for your visit, go to the MyChart app, find appointments, and click Begin Video Visit. Be sure to Select Allow for your device to access the Microphone and Camera for your visit. You will then be connected, and your provider will be with you shortly.  **If you have any issues connecting or need assistance, please contact MyChart service desk [phone])**  **If using a computer, in order to ensure the best quality for your visit, you will need to use either of the following Internet Browsers: [employer] consultant or Microsoft Edge**   IF USING DOXIMITY or [email or web address] - The staff will give you instructions on receiving your link to join the meeting the day of your visit.      2-3 DAYS BEFORE YOUR APPOINTMENT  You will receive a telephone call from one of our [facility] team members - your caller ID may say "Unknown caller." If this is a video visit, we will walk you through how to get the video launched on your phone. We will remind you check your blood pressure, heart rate and weight prior to your scheduled appointment. If you have an Apple Watch or Kardia, please upload any pertinent ECG strips the day before or morning of your appointment to MyChart. Our staff will also make sure you have  reviewed the consent and agree to move forward with your scheduled tele-health visit.     THE DAY OF YOUR APPOINTMENT  Approximately 15 minutes prior to your scheduled appointment, you will receive a telephone call from one of [facility] team - your caller ID may say "Unknown caller."  Our staff will confirm medications, vital signs for the day and any symptoms you may be experiencing. Please have this information available prior to the time of visit start. It may also be helpful for you to have a pad of paper and pen handy for any instructions given during your visit. They will also  walk you through joining the smartphone meeting if this is a video visit.    CONSENT FOR TELE-HEALTH VISIT - PLEASE REVIEW  I hereby voluntarily request, consent and authorize [facility] and its employed or contracted physicians, physician assistants, nurse practitioners or other licensed health care professionals (the Practitioner), to provide me with telemedicine health care services (the Services") as deemed necessary by the treating Practitioner. I acknowledge and consent to receive the Services by the Practitioner via telemedicine. I understand that the telemedicine visit will involve communicating with the Practitioner through live audiovisual communication technology and the disclosure of certain medical information by electronic transmission. I acknowledge that I have been given the opportunity to request an in-person assessment or other available alternative prior to the telemedicine visit and am voluntarily participating in the telemedicine visit.  I understand that I have the right to withhold or withdraw my consent to the use of telemedicine in the course of my care at any time, without affecting my right to future care or treatment, and that the Practitioner or I may terminate the telemedicine visit at any time. I understand that I have the right to inspect all information obtained and/or recorded in the course of the telemedicine visit and may receive copies of available information for a reasonable fee.  I understand that some of the potential risks of receiving the Services via telemedicine include:   Delay or interruption in medical evaluation due to technological equipment failure or disruption;  Information transmitted may not be sufficient (e.g. poor resolution of images) to allow for appropriate medical decision making by the Practitioner; and/or   In rare instances, security protocols could fail, causing a breach of personal health information.  Furthermore, I acknowledge  that it is my responsibility to provide information about my medical history, conditions and care that is complete and accurate to the best of my ability. I acknowledge that Practitioner's advice, recommendations, and/or decision may be based on factors not within their control, such as incomplete or inaccurate data provided by me or distortions of diagnostic images or specimens that may result from electronic transmissions. I understand that the practice of medicine is not an exact science and that Practitioner makes no warranties or guarantees regarding treatment outcomes. I acknowledge that I will receive a copy of this consent concurrently upon execution via email to the email address I last provided but may also request a printed copy by calling the office of [facility].    I understand that my insurance will be billed for this visit.   I have read or had this consent read to me.  I understand the contents of this consent, which adequately explains the benefits and risks of the Services being provided via telemedicine.   I have been provided ample opportunity to ask questions regarding this consent and the Services and have had my questions answered  to my satisfaction.  I give my informed consent for the services to be provided through the use of telemedicine in my medical care  By participating in this telemedicine visit I agree to the above.

## 2018-09-15 ENCOUNTER — Telehealth: Payer: Medicaid Other | Admitting: Internal Medicine

## 2018-12-15 IMAGING — DX DG CHEST 2V
2 series · 2 of 2 positions shown · non-contrast
Comparison: August 26, 2017

CLINICAL DATA: Chest pain and shortness of breath

EXAM:
CHEST - 2 VIEW

[chest pa]
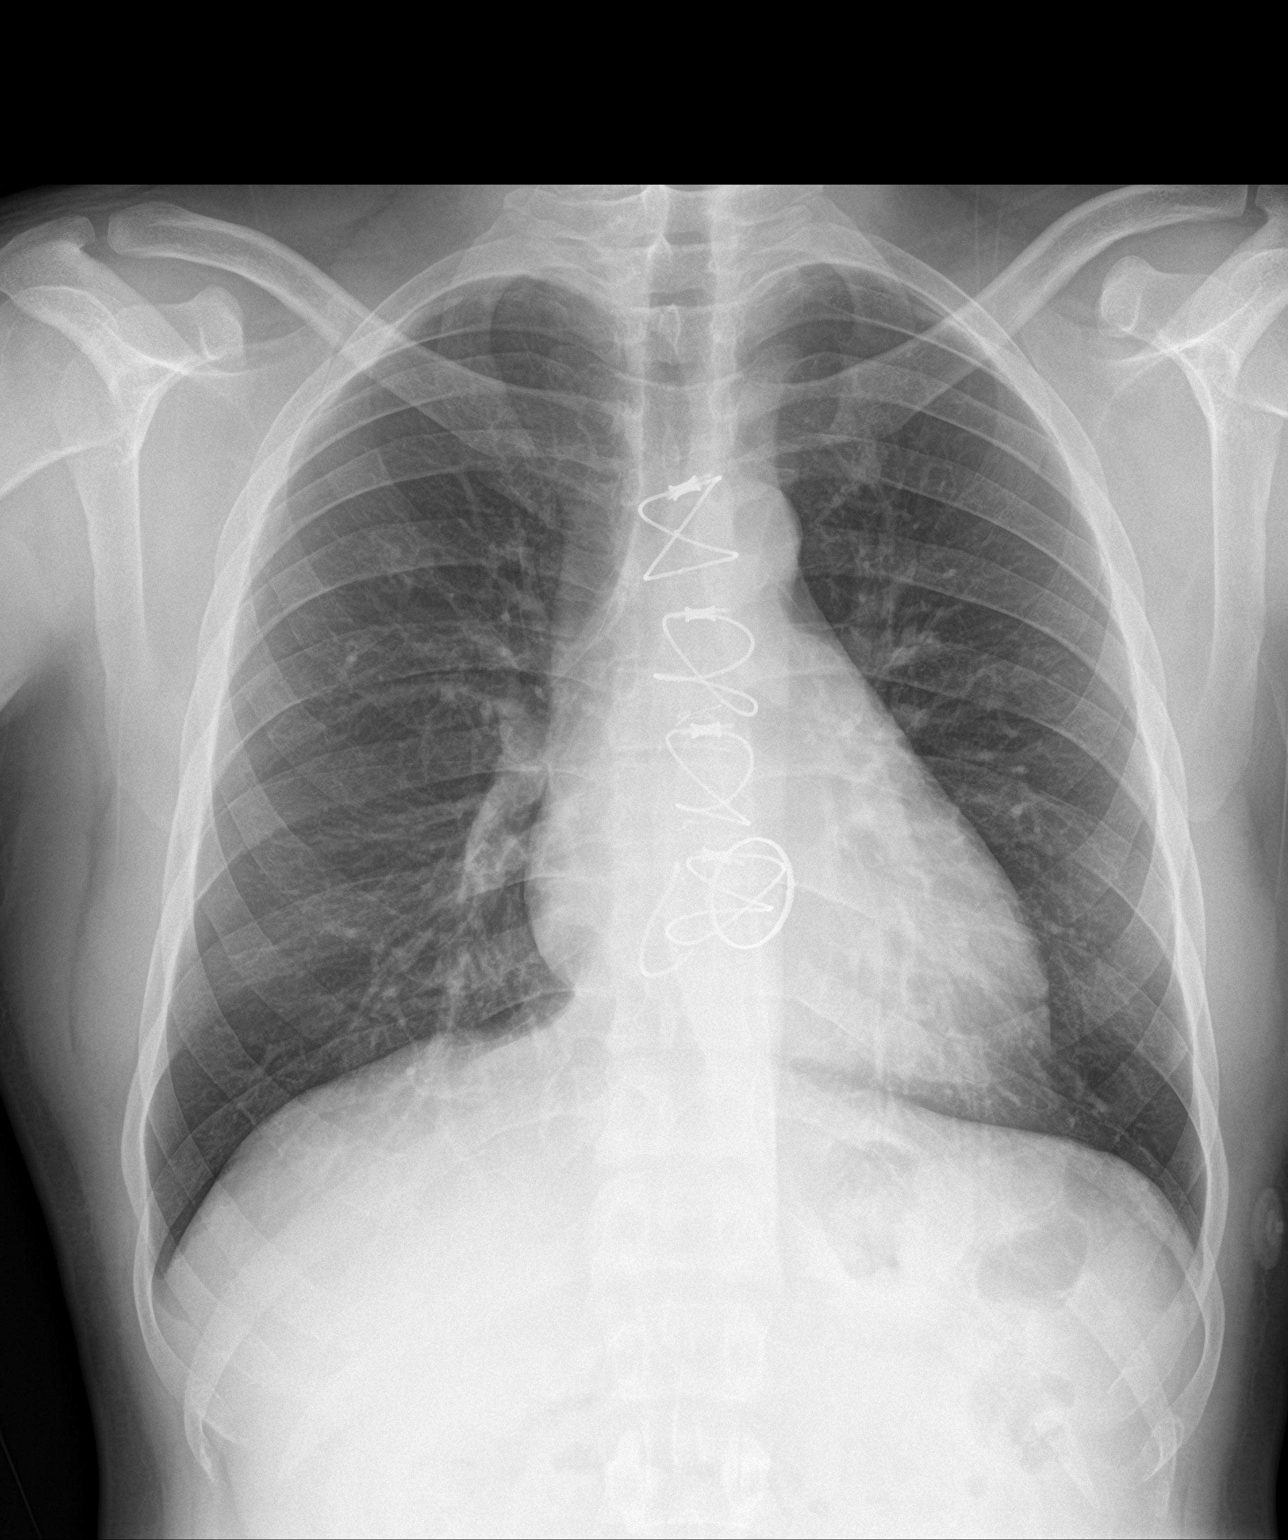

[chest lat]
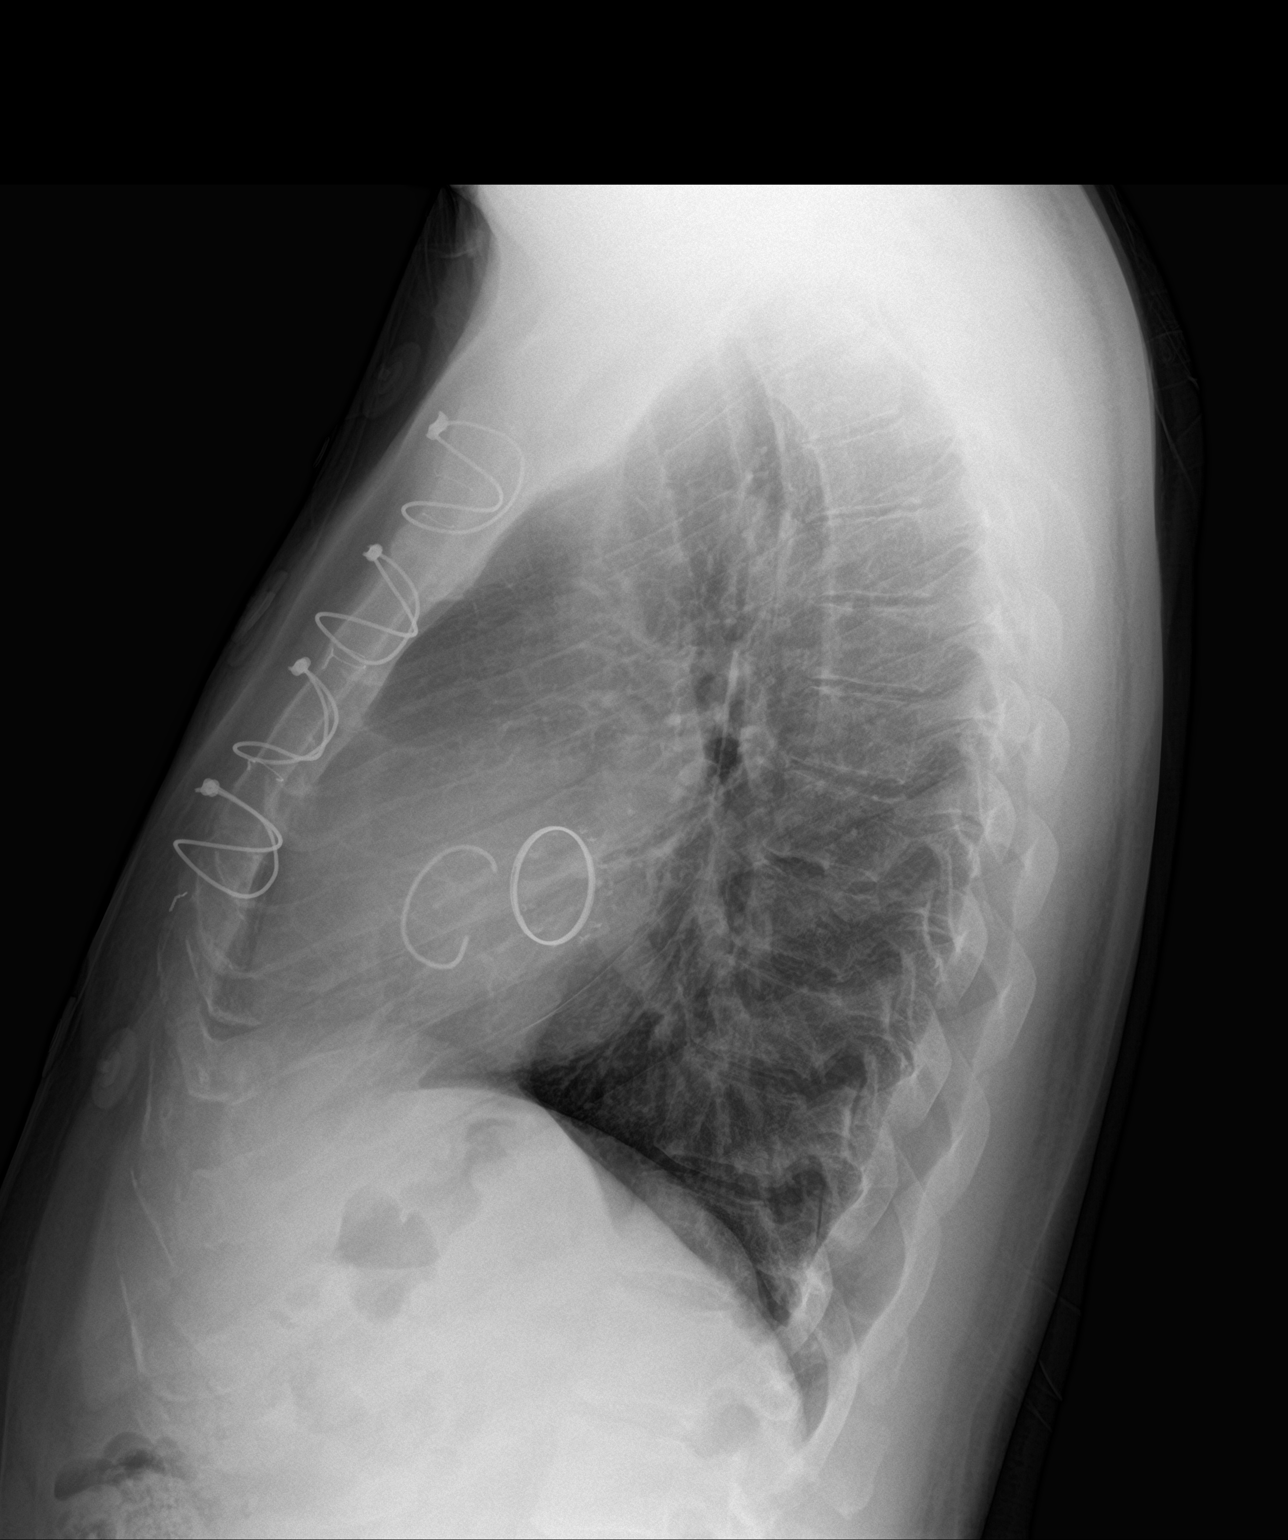

[2 of 2 positions shown; findings below may reference images not displayed]

FINDINGS: There is no edema or consolidation. Heart size and pulmonary
vascularity are normal. No adenopathy. Patient is status post mitral
and tricuspid valve replacements. No pneumothorax. No bone lesions.
IMPRESSION: Status post mitral and tricuspid valve replacements. Heart size
within normal limits. Lungs clear.

## 2019-08-23 ENCOUNTER — Other Ambulatory Visit: Payer: Self-pay | Admitting: *Deleted

## 2019-08-23 MED ORDER — METOPROLOL TARTRATE 25 MG PO TABS: ORAL_TABLET | ORAL | 0 refills | Status: AC
